# Patient Record
Sex: Female | Born: 1960 | Race: White | Hispanic: No | Marital: Married | Smoking: Never smoker
Health system: Southern US, Community
[De-identification: ages and names within clinical notes are randomized; demographics above are authoritative.]

## PROBLEM LIST (undated history)

## (undated) DIAGNOSIS — I10 Essential (primary) hypertension: Secondary | ICD-10-CM

## (undated) DIAGNOSIS — M1990 Unspecified osteoarthritis, unspecified site: Secondary | ICD-10-CM

## (undated) DIAGNOSIS — R7303 Prediabetes: Secondary | ICD-10-CM

## (undated) DIAGNOSIS — M549 Dorsalgia, unspecified: Secondary | ICD-10-CM

## (undated) HISTORY — PX: DILATION AND CURETTAGE OF UTERUS: SHX78

## (undated) HISTORY — DX: Dorsalgia, unspecified: M54.9

## (undated) HISTORY — PX: COLONOSCOPY: SHX174

## (undated) HISTORY — DX: Essential (primary) hypertension: I10

---

## 1999-02-26 ENCOUNTER — Other Ambulatory Visit: Admission: RE | Admit: 1999-02-26 | Discharge: 1999-02-26 | Payer: Self-pay | Admitting: Gynecology

## 2003-01-15 ENCOUNTER — Ambulatory Visit (HOSPITAL_COMMUNITY): Admission: RE | Admit: 2003-01-15 | Discharge: 2003-01-15 | Payer: Self-pay | Admitting: Family Medicine

## 2003-01-15 ENCOUNTER — Encounter: Payer: Self-pay | Admitting: Family Medicine

## 2006-12-07 ENCOUNTER — Encounter: Admission: RE | Admit: 2006-12-07 | Discharge: 2007-01-10 | Payer: Self-pay | Admitting: *Deleted

## 2010-10-31 ENCOUNTER — Other Ambulatory Visit (HOSPITAL_COMMUNITY): Payer: Self-pay | Admitting: Obstetrics and Gynecology

## 2010-10-31 DIAGNOSIS — R928 Other abnormal and inconclusive findings on diagnostic imaging of breast: Secondary | ICD-10-CM

## 2010-11-06 ENCOUNTER — Ambulatory Visit
Admission: RE | Admit: 2010-11-06 | Discharge: 2010-11-06 | Disposition: A | Discharge status: Home / Self Care | Payer: BC Managed Care – HMO | Source: Ambulatory Visit | Attending: Obstetrics and Gynecology | Admitting: Obstetrics and Gynecology

## 2010-11-06 ENCOUNTER — Other Ambulatory Visit (HOSPITAL_COMMUNITY): Payer: Self-pay | Admitting: Obstetrics and Gynecology

## 2010-11-06 DIAGNOSIS — R928 Other abnormal and inconclusive findings on diagnostic imaging of breast: Secondary | ICD-10-CM

## 2013-09-13 ENCOUNTER — Other Ambulatory Visit: Payer: Self-pay | Admitting: Obstetrics and Gynecology

## 2013-09-13 DIAGNOSIS — R928 Other abnormal and inconclusive findings on diagnostic imaging of breast: Secondary | ICD-10-CM

## 2013-09-21 ENCOUNTER — Other Ambulatory Visit: Payer: BC Managed Care – HMO

## 2013-09-22 ENCOUNTER — Ambulatory Visit
Admission: RE | Admit: 2013-09-22 | Discharge: 2013-09-22 | Disposition: A | Discharge status: Home / Self Care | Payer: BC Managed Care – PPO | Source: Ambulatory Visit | Attending: Obstetrics and Gynecology | Admitting: Obstetrics and Gynecology

## 2013-09-22 ENCOUNTER — Other Ambulatory Visit: Payer: Self-pay | Admitting: Obstetrics and Gynecology

## 2013-09-22 DIAGNOSIS — R928 Other abnormal and inconclusive findings on diagnostic imaging of breast: Secondary | ICD-10-CM

## 2013-10-06 ENCOUNTER — Ambulatory Visit
Admission: RE | Admit: 2013-10-06 | Discharge: 2013-10-06 | Disposition: A | Discharge status: Home / Self Care | Payer: BC Managed Care – PPO | Source: Ambulatory Visit | Attending: Obstetrics and Gynecology | Admitting: Obstetrics and Gynecology

## 2013-10-06 DIAGNOSIS — R928 Other abnormal and inconclusive findings on diagnostic imaging of breast: Secondary | ICD-10-CM

## 2014-09-13 ENCOUNTER — Other Ambulatory Visit: Payer: Self-pay | Admitting: Obstetrics and Gynecology

## 2014-11-14 ENCOUNTER — Other Ambulatory Visit: Payer: Self-pay | Admitting: Obstetrics and Gynecology

## 2014-11-15 LAB — CYTOLOGY - PAP

## 2014-11-21 IMAGING — MG MM DIANOSTIC UNILATERAL R
2 series · 2 of 2 positions shown · non-contrast
Comparison: 09/08/2013 and prior

CLINICAL DATA: The patient returns for further evaluation of a
questionable mass in the right breast identified on recent screening
mammogram performed on 09/08/2013

EXAM:
DIGITAL DIAGNOSTIC  right MAMMOGRAM WITH CAD
ULTRASOUND right BREAST

[R CC]
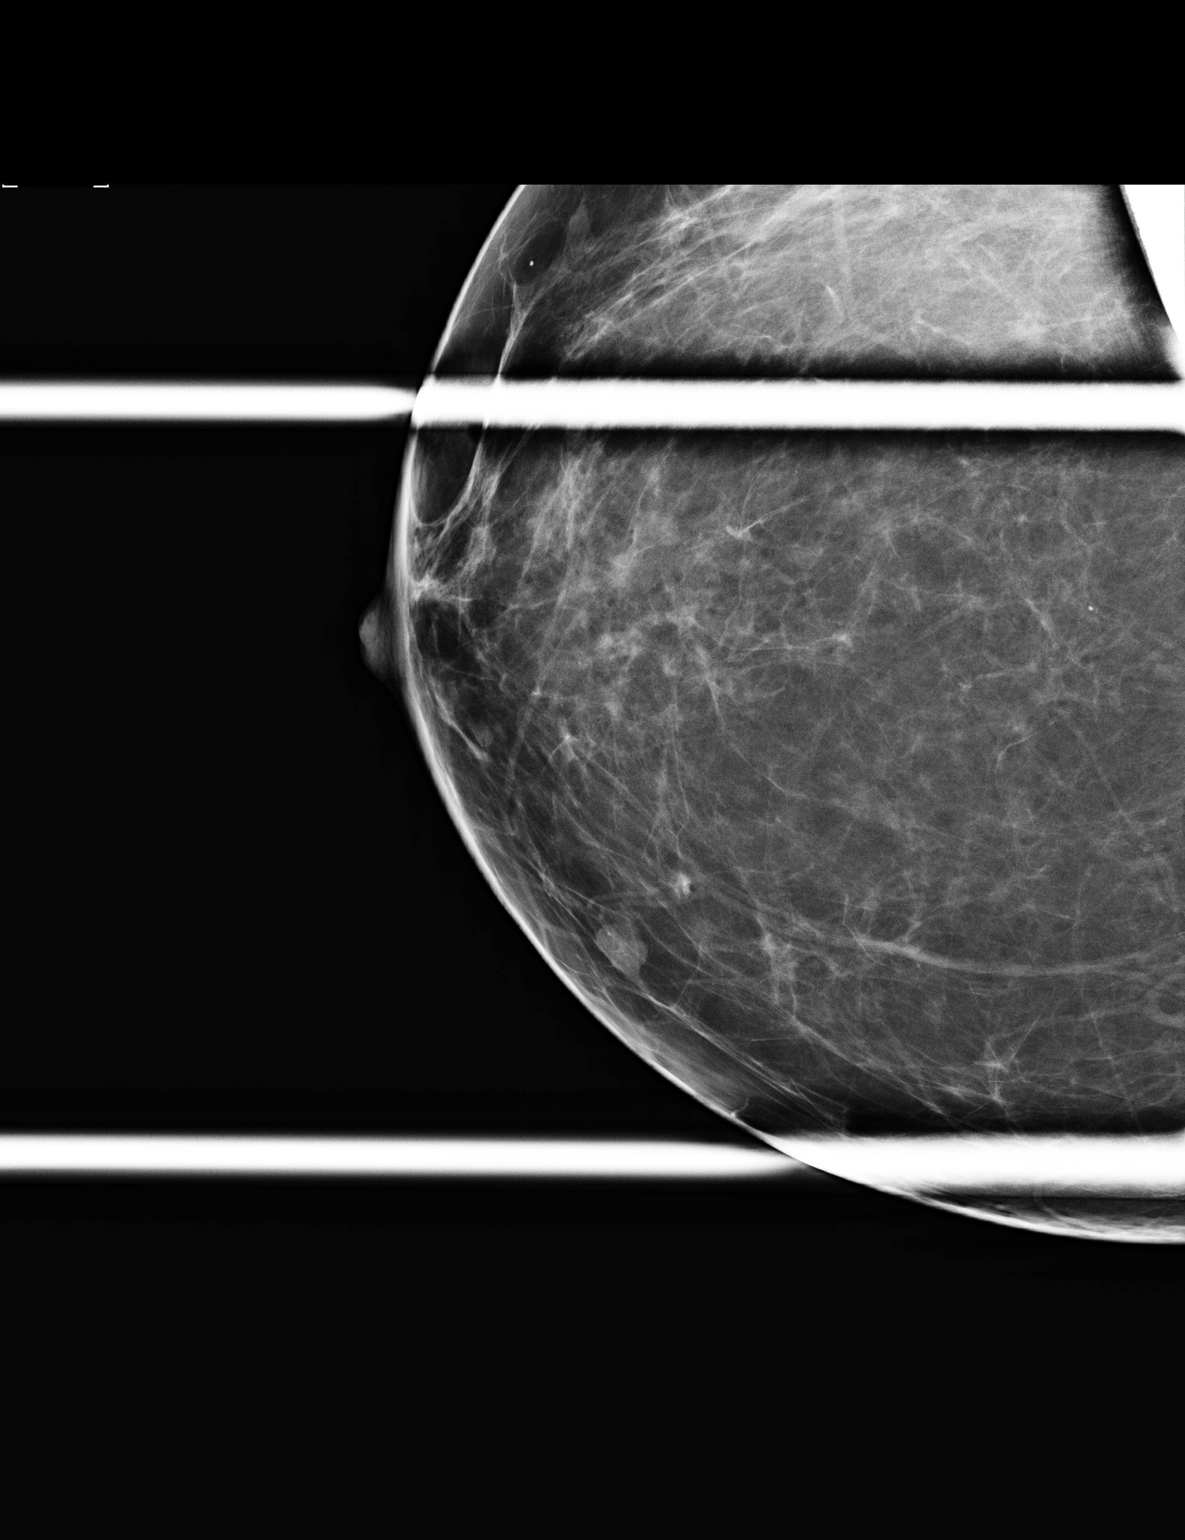

[R MLO]
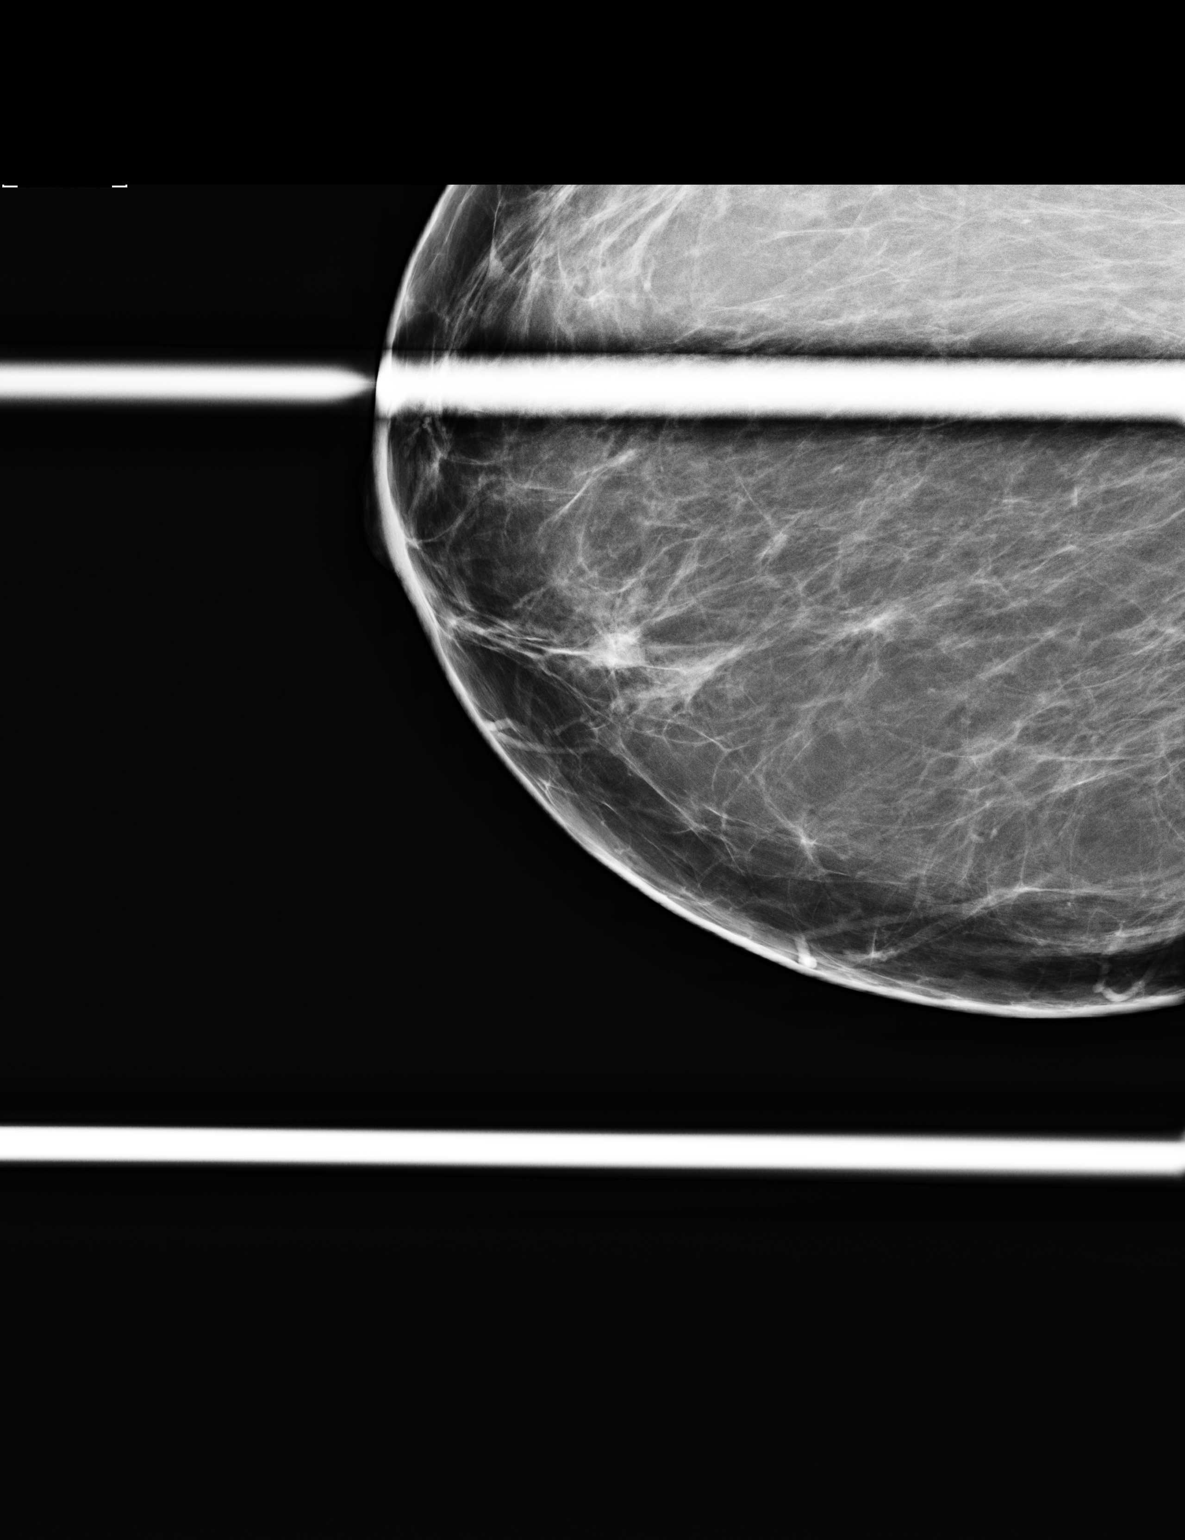

[2 of 2 positions shown; findings below may reference images not displayed]

ACR Breast Density Category b: There are scattered areas of
fibroglandular density.
FINDINGS: Spot compression views were performed in the craniocaudad and medial
lateral oblique planes and reveal a predominantly circumscribed,
partially obscured, lobulated equal density mass in the right breast
at approximately 3 o'clock measuring approximately 8 mm this
demonstrates no obvious calcifications or distortion. This was
present on prior exams dating back to 6216 but has increased
slightly in size from 6 mm in 6216.

Mammographic images were processed with CAD.

On physical exam,no palpable abnormality is identified..

Ultrasound is performed, showing a circumscribed hypoechoic oval
mildly lobulated solid mass demonstrating mild increased through
transmission measuring 7 x 5 x 4 mm at 3 o'clock position 2 cm from
the nipple. This has probably benign sonographic features and most
likely represents a small fibroadenoma..
IMPRESSION: Slight increase in size in a circumscribed nodule in the right inner
breast corresponds with a probably benign mass at 3 o'clock on
focused breast ultrasound, likely a fibroadenoma.

RECOMMENDATION:
We discussed management options including excision,
ultrasound-guided core biopsy, and short term interval follow-up.
The patient desires tissue confirmation with ultrasound guided core
biopsy and this has been scheduled at her convenience on 10/06/2013
at 3 p.m.

I have discussed the findings and recommendations with the patient.
Results were also provided in writing at the conclusion of the
visit.

BI-RADS CATEGORY  3: Probably benign finding(s) - short interval
follow-up suggested.

## 2014-11-28 ENCOUNTER — Other Ambulatory Visit: Payer: Self-pay | Admitting: Family Medicine

## 2014-11-28 DIAGNOSIS — Z0000 Encounter for general adult medical examination without abnormal findings: Secondary | ICD-10-CM

## 2014-12-05 ENCOUNTER — Other Ambulatory Visit: Payer: Self-pay

## 2014-12-05 IMAGING — MG MM DIANOSTIC UNILATERAL R
2 series · 2 of 2 positions shown · non-contrast
Comparison: Previous exams

CLINICAL DATA: Post biopsy of a mass in the right breast at 3
o'clock.

EXAM:
DIAGNOSTIC RIGHT MAMMOGRAM POST ULTRASOUND BIOPSY

[R CC]
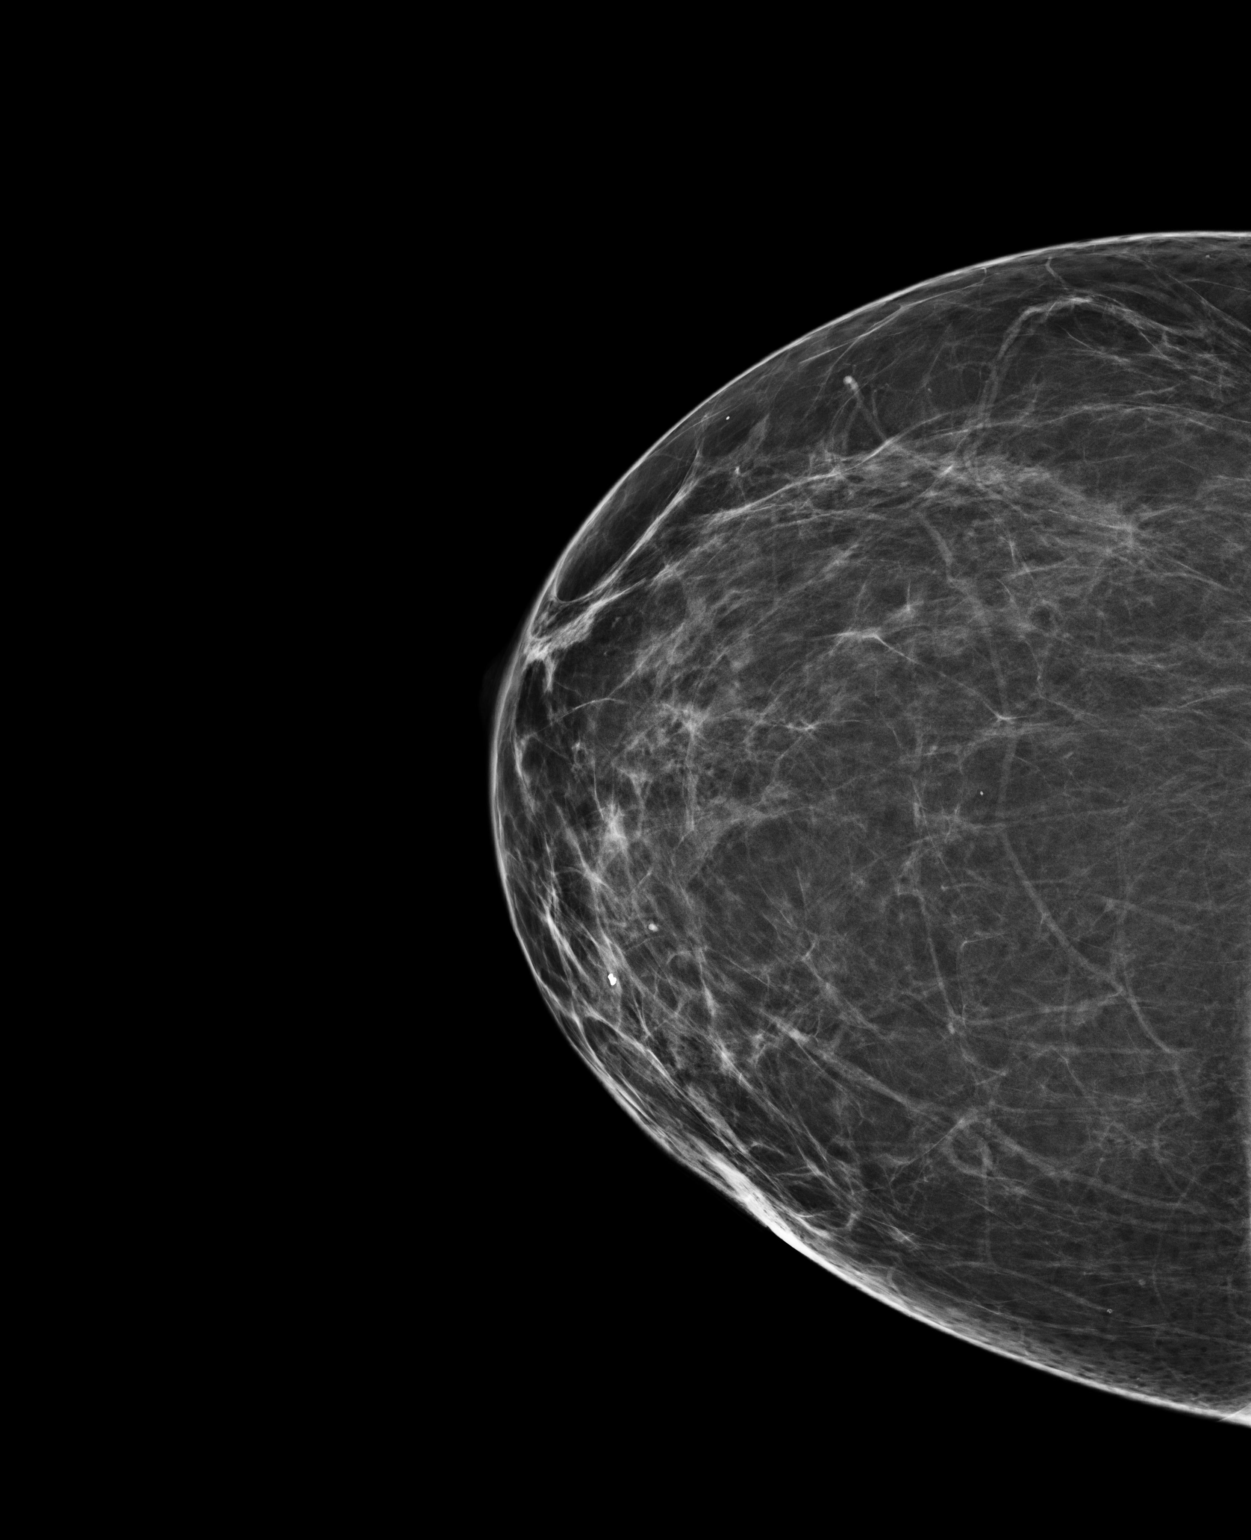

[R ML]
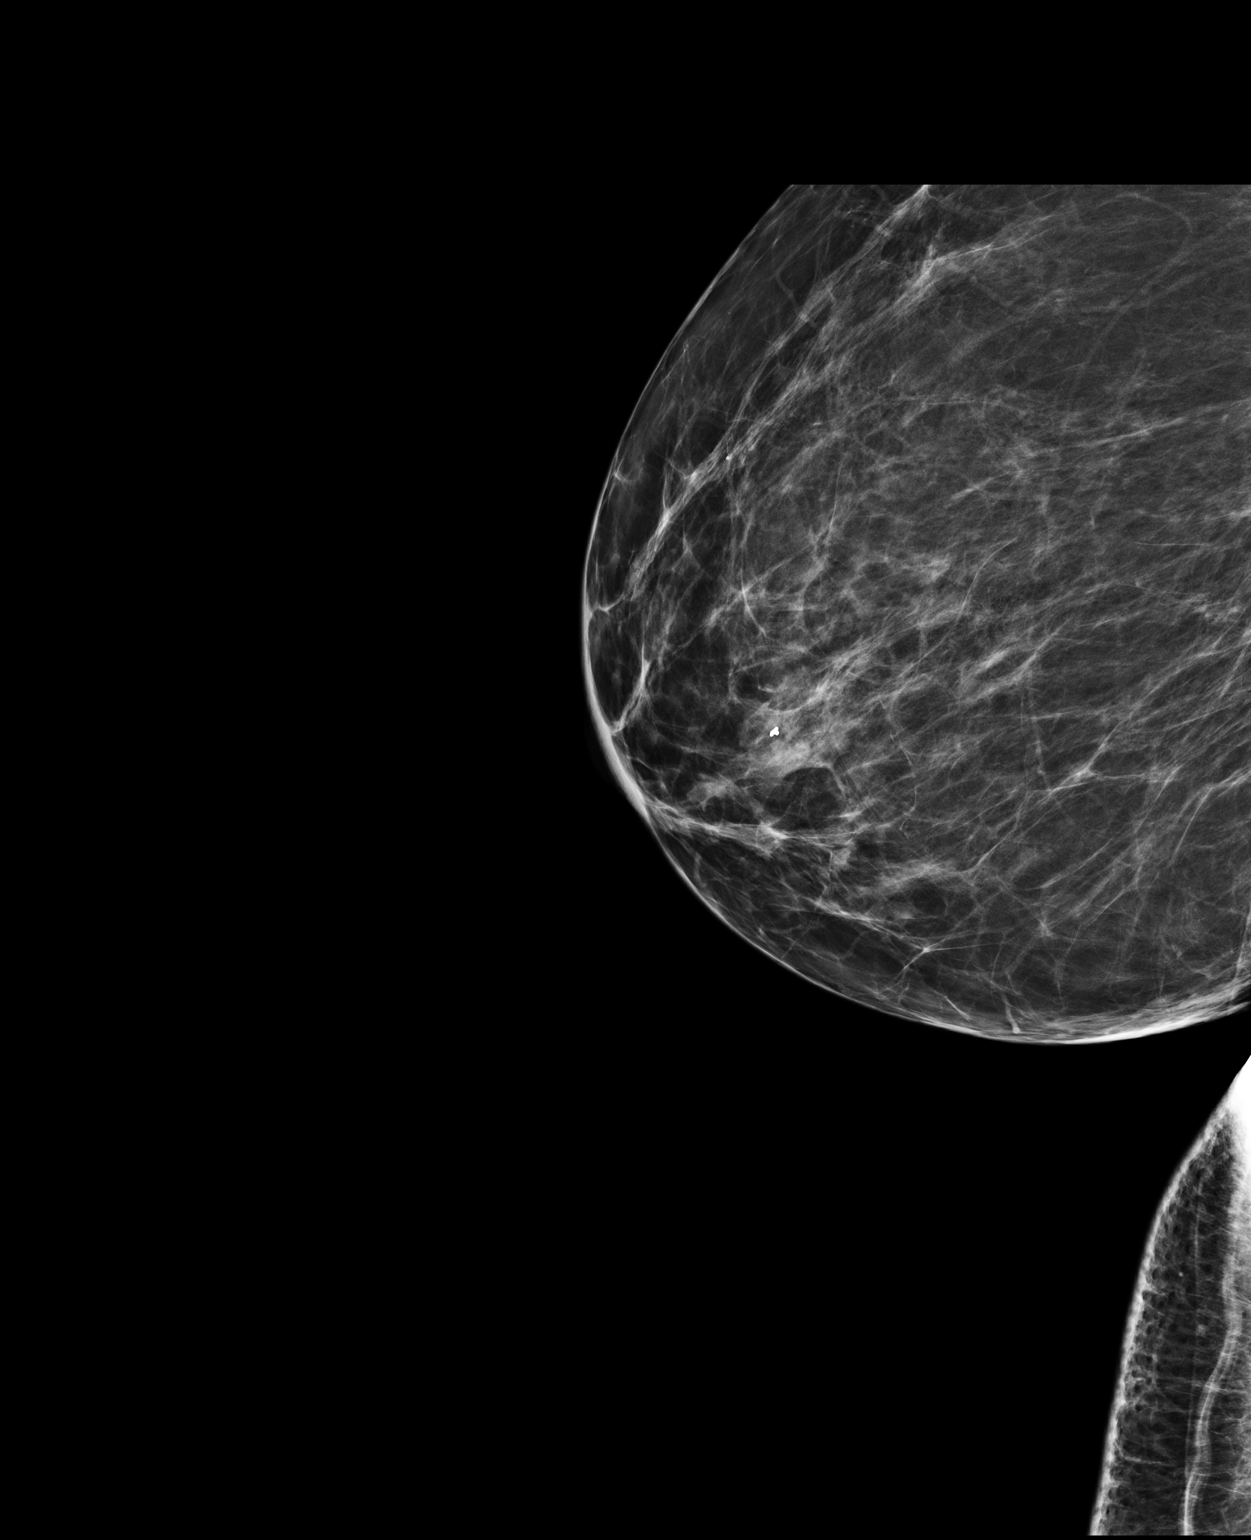

[2 of 2 positions shown; findings below may reference images not displayed]

FINDINGS: Mammographic images were obtained following ultrasound guided biopsy
of a mass in the superficial right breast at 3 o'clock. A top hat
shaped biopsy marking clip is present in the targeted region of the
mass.
IMPRESSION: Appropriate positioning of top hat shaped biopsy marking clip in the
right breast post biopsy of a mass at 3 o'clock.

Final Assessment: Post Procedure Mammograms for Marker Placement

## 2014-12-07 ENCOUNTER — Ambulatory Visit
Admission: RE | Admit: 2014-12-07 | Discharge: 2014-12-07 | Disposition: A | Discharge status: Home / Self Care | Payer: Self-pay | Source: Ambulatory Visit | Attending: Family Medicine | Admitting: Family Medicine

## 2014-12-07 DIAGNOSIS — Z0000 Encounter for general adult medical examination without abnormal findings: Secondary | ICD-10-CM

## 2014-12-11 ENCOUNTER — Other Ambulatory Visit: Payer: Self-pay | Admitting: Family Medicine

## 2014-12-11 DIAGNOSIS — E041 Nontoxic single thyroid nodule: Secondary | ICD-10-CM

## 2014-12-12 ENCOUNTER — Ambulatory Visit
Admission: RE | Admit: 2014-12-12 | Discharge: 2014-12-12 | Disposition: A | Discharge status: Home / Self Care | Payer: BLUE CROSS/BLUE SHIELD | Source: Ambulatory Visit | Attending: Family Medicine | Admitting: Family Medicine

## 2014-12-12 ENCOUNTER — Other Ambulatory Visit (HOSPITAL_COMMUNITY)
Admission: RE | Admit: 2014-12-12 | Discharge: 2014-12-12 | Disposition: A | Discharge status: Home / Self Care | Payer: BLUE CROSS/BLUE SHIELD | Source: Ambulatory Visit | Attending: Interventional Radiology | Admitting: Interventional Radiology

## 2014-12-12 DIAGNOSIS — E041 Nontoxic single thyroid nodule: Secondary | ICD-10-CM

## 2016-01-14 DIAGNOSIS — Z13820 Encounter for screening for osteoporosis: Secondary | ICD-10-CM | POA: Diagnosis not present

## 2016-03-30 DIAGNOSIS — I10 Essential (primary) hypertension: Secondary | ICD-10-CM | POA: Diagnosis not present

## 2016-03-30 DIAGNOSIS — E669 Obesity, unspecified: Secondary | ICD-10-CM | POA: Diagnosis not present

## 2016-05-21 DIAGNOSIS — H40019 Open angle with borderline findings, low risk, unspecified eye: Secondary | ICD-10-CM | POA: Diagnosis not present

## 2016-07-01 DIAGNOSIS — E559 Vitamin D deficiency, unspecified: Secondary | ICD-10-CM | POA: Diagnosis not present

## 2016-07-01 DIAGNOSIS — I10 Essential (primary) hypertension: Secondary | ICD-10-CM | POA: Diagnosis not present

## 2016-07-01 DIAGNOSIS — E669 Obesity, unspecified: Secondary | ICD-10-CM | POA: Diagnosis not present

## 2016-07-13 DIAGNOSIS — Z23 Encounter for immunization: Secondary | ICD-10-CM | POA: Diagnosis not present

## 2016-09-23 DIAGNOSIS — I10 Essential (primary) hypertension: Secondary | ICD-10-CM | POA: Diagnosis not present

## 2016-09-23 DIAGNOSIS — E669 Obesity, unspecified: Secondary | ICD-10-CM | POA: Diagnosis not present

## 2016-11-11 DIAGNOSIS — M7521 Bicipital tendinitis, right shoulder: Secondary | ICD-10-CM | POA: Diagnosis not present

## 2016-11-11 DIAGNOSIS — Z008 Encounter for other general examination: Secondary | ICD-10-CM | POA: Diagnosis not present

## 2016-11-11 DIAGNOSIS — E669 Obesity, unspecified: Secondary | ICD-10-CM | POA: Diagnosis not present

## 2016-11-21 DIAGNOSIS — R05 Cough: Secondary | ICD-10-CM | POA: Diagnosis not present

## 2016-11-21 DIAGNOSIS — B349 Viral infection, unspecified: Secondary | ICD-10-CM | POA: Diagnosis not present

## 2017-01-22 DIAGNOSIS — Z1231 Encounter for screening mammogram for malignant neoplasm of breast: Secondary | ICD-10-CM | POA: Diagnosis not present

## 2017-01-22 DIAGNOSIS — Z01419 Encounter for gynecological examination (general) (routine) without abnormal findings: Secondary | ICD-10-CM | POA: Diagnosis not present

## 2017-01-22 DIAGNOSIS — N760 Acute vaginitis: Secondary | ICD-10-CM | POA: Diagnosis not present

## 2017-01-22 DIAGNOSIS — Z6837 Body mass index (BMI) 37.0-37.9, adult: Secondary | ICD-10-CM | POA: Diagnosis not present

## 2017-02-02 DIAGNOSIS — E559 Vitamin D deficiency, unspecified: Secondary | ICD-10-CM | POA: Diagnosis not present

## 2017-02-02 DIAGNOSIS — Z0001 Encounter for general adult medical examination with abnormal findings: Secondary | ICD-10-CM | POA: Diagnosis not present

## 2017-02-02 DIAGNOSIS — E041 Nontoxic single thyroid nodule: Secondary | ICD-10-CM | POA: Diagnosis not present

## 2017-02-02 DIAGNOSIS — M25511 Pain in right shoulder: Secondary | ICD-10-CM | POA: Diagnosis not present

## 2017-02-02 DIAGNOSIS — Z131 Encounter for screening for diabetes mellitus: Secondary | ICD-10-CM | POA: Diagnosis not present

## 2017-02-04 DIAGNOSIS — E559 Vitamin D deficiency, unspecified: Secondary | ICD-10-CM | POA: Diagnosis not present

## 2017-02-04 DIAGNOSIS — Z13220 Encounter for screening for lipoid disorders: Secondary | ICD-10-CM | POA: Diagnosis not present

## 2017-02-04 DIAGNOSIS — Z0001 Encounter for general adult medical examination with abnormal findings: Secondary | ICD-10-CM | POA: Diagnosis not present

## 2017-02-04 DIAGNOSIS — E041 Nontoxic single thyroid nodule: Secondary | ICD-10-CM | POA: Diagnosis not present

## 2017-03-02 DIAGNOSIS — E669 Obesity, unspecified: Secondary | ICD-10-CM | POA: Diagnosis not present

## 2017-03-02 DIAGNOSIS — Z008 Encounter for other general examination: Secondary | ICD-10-CM | POA: Diagnosis not present

## 2017-03-02 DIAGNOSIS — I10 Essential (primary) hypertension: Secondary | ICD-10-CM | POA: Diagnosis not present

## 2017-03-02 DIAGNOSIS — E559 Vitamin D deficiency, unspecified: Secondary | ICD-10-CM | POA: Diagnosis not present

## 2017-05-24 DIAGNOSIS — I10 Essential (primary) hypertension: Secondary | ICD-10-CM | POA: Diagnosis not present

## 2017-05-24 DIAGNOSIS — E669 Obesity, unspecified: Secondary | ICD-10-CM | POA: Diagnosis not present

## 2017-05-24 DIAGNOSIS — E559 Vitamin D deficiency, unspecified: Secondary | ICD-10-CM | POA: Diagnosis not present

## 2017-07-08 DIAGNOSIS — H40019 Open angle with borderline findings, low risk, unspecified eye: Secondary | ICD-10-CM | POA: Diagnosis not present

## 2017-08-23 DIAGNOSIS — E669 Obesity, unspecified: Secondary | ICD-10-CM | POA: Diagnosis not present

## 2017-08-23 DIAGNOSIS — E559 Vitamin D deficiency, unspecified: Secondary | ICD-10-CM | POA: Diagnosis not present

## 2017-08-23 DIAGNOSIS — I10 Essential (primary) hypertension: Secondary | ICD-10-CM | POA: Diagnosis not present

## 2017-11-16 DIAGNOSIS — I10 Essential (primary) hypertension: Secondary | ICD-10-CM | POA: Diagnosis not present

## 2017-12-14 DIAGNOSIS — I10 Essential (primary) hypertension: Secondary | ICD-10-CM | POA: Diagnosis not present

## 2017-12-20 DIAGNOSIS — I10 Essential (primary) hypertension: Secondary | ICD-10-CM | POA: Diagnosis not present

## 2017-12-20 DIAGNOSIS — E559 Vitamin D deficiency, unspecified: Secondary | ICD-10-CM | POA: Diagnosis not present

## 2017-12-20 DIAGNOSIS — Z008 Encounter for other general examination: Secondary | ICD-10-CM | POA: Diagnosis not present

## 2017-12-20 DIAGNOSIS — E669 Obesity, unspecified: Secondary | ICD-10-CM | POA: Diagnosis not present

## 2018-02-03 DIAGNOSIS — Z6838 Body mass index (BMI) 38.0-38.9, adult: Secondary | ICD-10-CM | POA: Diagnosis not present

## 2018-02-03 DIAGNOSIS — Z01419 Encounter for gynecological examination (general) (routine) without abnormal findings: Secondary | ICD-10-CM | POA: Diagnosis not present

## 2018-02-03 DIAGNOSIS — Z1231 Encounter for screening mammogram for malignant neoplasm of breast: Secondary | ICD-10-CM | POA: Diagnosis not present

## 2018-02-09 DIAGNOSIS — Z0000 Encounter for general adult medical examination without abnormal findings: Secondary | ICD-10-CM | POA: Diagnosis not present

## 2018-02-09 DIAGNOSIS — Z1159 Encounter for screening for other viral diseases: Secondary | ICD-10-CM | POA: Diagnosis not present

## 2018-02-09 DIAGNOSIS — I10 Essential (primary) hypertension: Secondary | ICD-10-CM | POA: Diagnosis not present

## 2018-03-15 DIAGNOSIS — I10 Essential (primary) hypertension: Secondary | ICD-10-CM | POA: Diagnosis not present

## 2018-03-15 DIAGNOSIS — Z1159 Encounter for screening for other viral diseases: Secondary | ICD-10-CM | POA: Diagnosis not present

## 2018-03-23 DIAGNOSIS — E559 Vitamin D deficiency, unspecified: Secondary | ICD-10-CM | POA: Diagnosis not present

## 2018-03-23 DIAGNOSIS — E669 Obesity, unspecified: Secondary | ICD-10-CM | POA: Diagnosis not present

## 2018-03-23 DIAGNOSIS — I10 Essential (primary) hypertension: Secondary | ICD-10-CM | POA: Diagnosis not present

## 2018-06-21 DIAGNOSIS — J0100 Acute maxillary sinusitis, unspecified: Secondary | ICD-10-CM | POA: Diagnosis not present

## 2018-06-27 DIAGNOSIS — E559 Vitamin D deficiency, unspecified: Secondary | ICD-10-CM | POA: Diagnosis not present

## 2018-06-27 DIAGNOSIS — Z008 Encounter for other general examination: Secondary | ICD-10-CM | POA: Diagnosis not present

## 2018-06-27 DIAGNOSIS — E669 Obesity, unspecified: Secondary | ICD-10-CM | POA: Diagnosis not present

## 2018-07-18 DIAGNOSIS — Z23 Encounter for immunization: Secondary | ICD-10-CM | POA: Diagnosis not present

## 2018-09-12 DIAGNOSIS — E669 Obesity, unspecified: Secondary | ICD-10-CM | POA: Diagnosis not present

## 2018-09-12 DIAGNOSIS — I10 Essential (primary) hypertension: Secondary | ICD-10-CM | POA: Diagnosis not present

## 2018-09-12 DIAGNOSIS — E041 Nontoxic single thyroid nodule: Secondary | ICD-10-CM | POA: Diagnosis not present

## 2018-09-12 DIAGNOSIS — E559 Vitamin D deficiency, unspecified: Secondary | ICD-10-CM | POA: Diagnosis not present

## 2018-11-02 DIAGNOSIS — J069 Acute upper respiratory infection, unspecified: Secondary | ICD-10-CM | POA: Diagnosis not present

## 2018-12-14 DIAGNOSIS — I10 Essential (primary) hypertension: Secondary | ICD-10-CM | POA: Diagnosis not present

## 2018-12-14 DIAGNOSIS — E559 Vitamin D deficiency, unspecified: Secondary | ICD-10-CM | POA: Diagnosis not present

## 2018-12-14 DIAGNOSIS — E041 Nontoxic single thyroid nodule: Secondary | ICD-10-CM | POA: Diagnosis not present

## 2019-03-08 DIAGNOSIS — E559 Vitamin D deficiency, unspecified: Secondary | ICD-10-CM | POA: Diagnosis not present

## 2019-03-08 DIAGNOSIS — E041 Nontoxic single thyroid nodule: Secondary | ICD-10-CM | POA: Diagnosis not present

## 2019-03-08 DIAGNOSIS — Z0000 Encounter for general adult medical examination without abnormal findings: Secondary | ICD-10-CM | POA: Diagnosis not present

## 2019-03-08 DIAGNOSIS — I10 Essential (primary) hypertension: Secondary | ICD-10-CM | POA: Diagnosis not present

## 2019-03-29 DIAGNOSIS — I10 Essential (primary) hypertension: Secondary | ICD-10-CM | POA: Diagnosis not present

## 2019-03-29 DIAGNOSIS — Z008 Encounter for other general examination: Secondary | ICD-10-CM | POA: Diagnosis not present

## 2019-03-29 DIAGNOSIS — E669 Obesity, unspecified: Secondary | ICD-10-CM | POA: Diagnosis not present

## 2019-03-29 DIAGNOSIS — E559 Vitamin D deficiency, unspecified: Secondary | ICD-10-CM | POA: Diagnosis not present

## 2019-06-26 DIAGNOSIS — E669 Obesity, unspecified: Secondary | ICD-10-CM | POA: Diagnosis not present

## 2019-06-26 DIAGNOSIS — Z008 Encounter for other general examination: Secondary | ICD-10-CM | POA: Diagnosis not present

## 2019-06-26 DIAGNOSIS — E559 Vitamin D deficiency, unspecified: Secondary | ICD-10-CM | POA: Diagnosis not present

## 2019-07-10 DIAGNOSIS — I10 Essential (primary) hypertension: Secondary | ICD-10-CM | POA: Insufficient documentation

## 2019-07-11 DIAGNOSIS — Z6838 Body mass index (BMI) 38.0-38.9, adult: Secondary | ICD-10-CM | POA: Diagnosis not present

## 2019-07-11 DIAGNOSIS — Z1231 Encounter for screening mammogram for malignant neoplasm of breast: Secondary | ICD-10-CM | POA: Diagnosis not present

## 2019-07-11 DIAGNOSIS — Z01419 Encounter for gynecological examination (general) (routine) without abnormal findings: Secondary | ICD-10-CM | POA: Diagnosis not present

## 2019-07-17 DIAGNOSIS — Z23 Encounter for immunization: Secondary | ICD-10-CM | POA: Diagnosis not present

## 2019-09-13 DIAGNOSIS — I10 Essential (primary) hypertension: Secondary | ICD-10-CM | POA: Diagnosis not present

## 2019-09-18 DIAGNOSIS — E559 Vitamin D deficiency, unspecified: Secondary | ICD-10-CM | POA: Diagnosis not present

## 2019-09-18 DIAGNOSIS — E041 Nontoxic single thyroid nodule: Secondary | ICD-10-CM | POA: Diagnosis not present

## 2019-09-18 DIAGNOSIS — Z008 Encounter for other general examination: Secondary | ICD-10-CM | POA: Diagnosis not present

## 2019-12-09 ENCOUNTER — Other Ambulatory Visit: Payer: Self-pay

## 2019-12-09 ENCOUNTER — Ambulatory Visit: Payer: Self-pay

## 2019-12-09 ENCOUNTER — Ambulatory Visit: Payer: Self-pay | Attending: Internal Medicine

## 2019-12-09 DIAGNOSIS — Z23 Encounter for immunization: Secondary | ICD-10-CM | POA: Insufficient documentation

## 2019-12-09 NOTE — Progress Notes (Signed)
   Covid-19 Vaccination Clinic  Name:  Jessica Drake    MRN: [id number] DOB: 07-04-1961  12/09/2019  [name] was observed post Covid-19 immunization for 15 minutes without incident. She was provided with Vaccine Information Sheet and instruction to access the V-Safe system.   [name] was instructed to call 911 with any severe reactions post vaccine: Marland Kitchen Difficulty breathing  . Swelling of face and throat  . A fast heartbeat  . A bad rash all over body  . Dizziness and weakness   Immunizations Administered    Name Date Dose VIS Date Route   Moderna COVID-19 Vaccine 12/09/2019  9:01 AM 0.5 mL 09/05/2019 Intramuscular   Manufacturer: Moderna   Lot: [id number]   [id number]

## 2020-01-10 ENCOUNTER — Ambulatory Visit: Payer: Self-pay | Attending: Internal Medicine

## 2020-01-10 DIAGNOSIS — Z23 Encounter for immunization: Secondary | ICD-10-CM

## 2020-01-10 NOTE — Progress Notes (Signed)
   Covid-19 Vaccination Clinic  Name:  Jessica Drake    MRN: [id number] DOB: 1961-07-07  01/10/2020  [name] was observed post Covid-19 immunization for 15 minutes without incident. She was provided with Vaccine Information Sheet and instruction to access the V-Safe system.   [name] was instructed to call 911 with any severe reactions post vaccine: Marland Kitchen Difficulty breathing  . Swelling of face and throat  . A fast heartbeat  . A bad rash all over body  . Dizziness and weakness   Immunizations Administered    Name Date Dose VIS Date Route   Moderna COVID-19 Vaccine 01/10/2020  9:14 AM 0.5 mL 09/05/2019 Intramuscular   Manufacturer: Levan Hurst   LotUD:[id number]   [place]:[id number]

## 2020-01-16 DIAGNOSIS — L820 Inflamed seborrheic keratosis: Secondary | ICD-10-CM | POA: Diagnosis not present

## 2020-04-02 DIAGNOSIS — Z13220 Encounter for screening for lipoid disorders: Secondary | ICD-10-CM | POA: Diagnosis not present

## 2020-04-02 DIAGNOSIS — Z6839 Body mass index (BMI) 39.0-39.9, adult: Secondary | ICD-10-CM | POA: Diagnosis not present

## 2020-04-02 DIAGNOSIS — E041 Nontoxic single thyroid nodule: Secondary | ICD-10-CM | POA: Diagnosis not present

## 2020-04-02 DIAGNOSIS — I10 Essential (primary) hypertension: Secondary | ICD-10-CM | POA: Diagnosis not present

## 2020-04-02 DIAGNOSIS — Z0000 Encounter for general adult medical examination without abnormal findings: Secondary | ICD-10-CM | POA: Diagnosis not present

## 2020-04-17 ENCOUNTER — Encounter (INDEPENDENT_AMBULATORY_CARE_PROVIDER_SITE_OTHER): Payer: Self-pay | Admitting: Bariatrics

## 2020-04-22 ENCOUNTER — Ambulatory Visit (INDEPENDENT_AMBULATORY_CARE_PROVIDER_SITE_OTHER): Payer: BC Managed Care – PPO | Admitting: Bariatrics

## 2020-04-22 ENCOUNTER — Encounter (INDEPENDENT_AMBULATORY_CARE_PROVIDER_SITE_OTHER): Payer: Self-pay | Admitting: Bariatrics

## 2020-04-22 ENCOUNTER — Other Ambulatory Visit: Payer: Self-pay

## 2020-04-22 VITALS — BP 129/84 | HR 74 | Temp 98.1°F | Ht 68.0 in | Wt 244.0 lb

## 2020-04-22 DIAGNOSIS — E7800 Pure hypercholesterolemia, unspecified: Secondary | ICD-10-CM | POA: Diagnosis not present

## 2020-04-22 DIAGNOSIS — I10 Essential (primary) hypertension: Secondary | ICD-10-CM

## 2020-04-22 DIAGNOSIS — R0602 Shortness of breath: Secondary | ICD-10-CM

## 2020-04-22 DIAGNOSIS — Z1331 Encounter for screening for depression: Secondary | ICD-10-CM | POA: Diagnosis not present

## 2020-04-22 DIAGNOSIS — M5489 Other dorsalgia: Secondary | ICD-10-CM

## 2020-04-22 DIAGNOSIS — E559 Vitamin D deficiency, unspecified: Secondary | ICD-10-CM

## 2020-04-22 DIAGNOSIS — R5383 Other fatigue: Secondary | ICD-10-CM | POA: Diagnosis not present

## 2020-04-22 DIAGNOSIS — Z6837 Body mass index (BMI) 37.0-37.9, adult: Secondary | ICD-10-CM

## 2020-04-22 DIAGNOSIS — Z9189 Other specified personal risk factors, not elsewhere classified: Secondary | ICD-10-CM | POA: Diagnosis not present

## 2020-04-22 DIAGNOSIS — Z0289 Encounter for other administrative examinations: Secondary | ICD-10-CM

## 2020-04-22 NOTE — Progress Notes (Signed)
Dear Dr. Milagros Evener,   Thank you for referring Jessica Drake to our clinic. The following note includes my evaluation and treatment recommendations.  Chief Complaint:   Jessica Drake (MR# [id number]) is a 59 y.o. female who presents for evaluation and treatment of Jessica and related comorbidities. Current BMI is Body mass index is 37.1 kg/m.Marland Kitchen Jessica Drake has been struggling with her weight for many years and has been unsuccessful in either losing weight, maintaining weight loss, or reaching her healthy weight goal.  Jessica Drake is currently in the action stage of change and ready to dedicate time achieving and maintaining a healthier weight. Jessica Drake is interested in becoming our patient and working on intensive lifestyle modifications including (but not limited to) diet and exercise for weight loss.  Jessica Drake likes to [employer], but notes time as an obstacle. She craves sweets. She struggles with portion size.  Jessica Drake's habits were reviewed today and are as follows: Her family eats meals together, she thinks her family will eat healthier with her, her desired weight loss is 54 lbs, she has been heavy most of her life, she started gaining weight when she was pregnant, her heaviest weight ever was 260 pounds, she craves sweets in the afternoon, she sometimes makes poor food choices, she frequently eats larger portions than normal, she has binge eating behaviors and she struggles with emotional eating.  Depression Screen Jessica Drake's Food and Mood (modified PHQ-9) score was 8.  Depression screen PHQ 2/9 04/22/2020  Decreased Interest 1  Down, Depressed, Hopeless 1  PHQ - 2 Score 2  Altered sleeping 0  Tired, decreased energy 3  Change in appetite 1  Feeling bad or failure about yourself  2  Trouble concentrating 0  Moving slowly or fidgety/restless 0  Suicidal thoughts 0  PHQ-9 Score 8  Difficult doing work/chores Not difficult at all   Subjective:   Other fatigue. Jessica Drake denies  daytime somnolence and denies waking up still tired. Jessica Drake generally gets 6-7 hours of sleep per night, and states that she has generally restful sleep. Snoring is present. Apneic episodes are not present. Epworth Sleepiness Score is 7.  SOB (shortness of breath) on exertion. Jessica Drake notes increasing shortness of breath with exercising and seems to be worsening over time with weight gain. She notes getting out of breath sooner with activity than she used to. This has gotten worse recently. Jessica Drake denies shortness of breath at rest or orthopnea.  Essential hypertension. Jessica Drake is taking losartan. Blood pressure is controlled.  BP Readings from Last 3 Encounters:  04/22/20 129/84   No results found for: CREATININE  Vitamin D deficiency. Jessica Drake is taking OTC Vitamin D supplementation. Last Vitamin D was reported to be 40.3.  Other back pain, unspecified chronicity. Jessica Drake reports flares of back pain, but is relatively well controlled.  Elevated cholesterol. Jessica Drake is on no medication. LDL 130; total cholesterol 210; HDL 67.  Depression screening. Jessica Drake had a mildly positive depression screen with a PHQ-9 score of 8.  At risk for heart disease. Jessica Drake is at a higher than average risk for cardiovascular disease due to hypertension.   Assessment/Plan:   Other fatigue. Atia does feel that her weight is causing her energy to be lower than it should be. Fatigue may be related to Jessica, depression or many other causes. Labs will be ordered, and in the meanwhile, [name] will focus on self care including making healthy food choices, increasing physical activity and focusing on stress reduction. EKG 12-Lead  SOB (shortness of breath) on exertion. Jessica Drake does feel that she gets out of breath more easily that she used to when she exercises. Jessica Drake's shortness of breath appears to be Jessica related and exercise induced. She has agreed to work on weight loss and gradually increase exercise to treat her  exercise induced shortness of breath. Will continue to monitor closely.  Essential hypertension. Jessica Drake is working on healthy weight loss and exercise to improve blood pressure control. We will watch for signs of hypotension as she continues her lifestyle modifications. She will continue losartan as directed.  Vitamin D deficiency. Low Vitamin D level contributes to fatigue and are associated with Jessica, breast, and colon cancer. She agrees to continue to take OTC Vitamin D as directed and will follow-up for routine testing of Vitamin D, at least 2-3 times per year to avoid over-replacement.  Other back pain, unspecified chronicity. Jessica Drake will continue to do stretches as directed.  Elevated cholesterol. Jessica Drake will avoid trans fats and will increase PUFA's and MUFA's. Hemoglobin A1c, Insulin, random will be checked today.  Depression screening. Jessica Drake had a positive depression screening. Depression is commonly associated with Jessica and often results in emotional eating behaviors. We will monitor this closely and work on CBT to help improve the non-hunger eating patterns. Referral to Psychology may be required if no improvement is seen as she continues in our clinic.  At risk for heart disease. Jessica Drake was given approximately 15 minutes of coronary artery disease prevention counseling today. She is 59 y.o. female and has risk factors for heart disease including Jessica. We discussed intensive lifestyle modifications today with an emphasis on specific weight loss instructions and strategies.   Repetitive spaced learning was employed today to elicit superior memory formation and behavioral change.  Class 2 severe Jessica with serious comorbidity and body mass index (BMI) of 37.0 to 37.9 in adult, unspecified Jessica type ([name]).  Jessica Drake is currently in the action stage of change and her goal is to continue with weight loss efforts. I recommend Jessica Drake begin the structured treatment plan as  follows:  She has agreed to the Category 1 Plan.  She will work on meal planning, portion size, and will cut out all sugary drinks.  We reviewed with the patient labs from 04/02/2020 including CMP and lipids; labs from 03/07/2020 including TSH and Vitamin D.  Exercise goals: All adults should avoid inactivity. Some physical activity is better than none, and adults who participate in any amount of physical activity gain some health benefits.   Behavioral modification strategies: increasing lean protein intake, decreasing simple carbohydrates, increasing vegetables, increasing water intake, decreasing eating out, no skipping meals, meal planning and cooking strategies, keeping healthy foods in the home and planning for success.  She was informed of the importance of frequent follow-up visits to maximize her success with intensive lifestyle modifications for her multiple health conditions. She was informed we would discuss her lab results at her next visit unless there is a critical issue that needs to be addressed sooner. Monea agreed to keep her next visit at the agreed upon time to discuss these results.  Objective:   Blood pressure 129/84, pulse 74, temperature 98.1 F (36.7 C), height 5\' 8"  (1.727 m), weight 244 lb (110.7 kg), SpO2 96 %. Body mass index is 37.1 kg/m.  EKG: Sinus  Rhythm with a rate of 73 BPM. Left axis - anterior fascicular block. Poor R-wave progression - nonspecific - may be related to body habitus. Otherwise normal.  Indirect Calorimeter completed today shows a VO2 of 196 and a REE of 1364.  Her calculated basal metabolic rate is 6222 thus her basal metabolic rate is worse than expected.  General: Cooperative, alert, well developed, in no acute distress. HEENT: Conjunctivae and lids unremarkable. Cardiovascular: Regular rhythm.  Lungs: Normal work of breathing. Neurologic: No focal deficits.   No results found for: CREATININE, BUN, NA, K, CL, CO2 No results  found for: ALT, AST, GGT, ALKPHOS, BILITOT No results found for: HGBA1C No results found for: INSULIN No results found for: TSH No results found for: CHOL, HDL, LDLCALC, LDLDIRECT, TRIG, CHOLHDL No results found for: WBC, HGB, HCT, MCV, PLT No results found for: IRON, TIBC, FERRITIN  Attestation Statements:   Reviewed by clinician on day of visit: allergies, medications, problem list, medical history, surgical history, family history, social history, and previous encounter notes.  Migdalia Dk, am acting as Location manager for [employer], DO   I have reviewed the above documentation for accuracy and completeness, and I agree with the above. Jearld Lesch, DO

## 2020-04-23 ENCOUNTER — Encounter (INDEPENDENT_AMBULATORY_CARE_PROVIDER_SITE_OTHER): Payer: Self-pay | Admitting: Bariatrics

## 2020-04-23 DIAGNOSIS — R7303 Prediabetes: Secondary | ICD-10-CM | POA: Insufficient documentation

## 2020-04-23 LAB — HEMOGLOBIN A1C
Est. average glucose Bld gHb Est-mCnc: 117 mg/dL
Hgb A1c MFr Bld: 5.7 % — ABNORMAL HIGH (ref 4.8–5.6)

## 2020-04-23 LAB — INSULIN, RANDOM: INSULIN: 10.7 u[IU]/mL (ref 2.6–24.9)

## 2020-04-24 DIAGNOSIS — J069 Acute upper respiratory infection, unspecified: Secondary | ICD-10-CM | POA: Diagnosis not present

## 2020-05-06 ENCOUNTER — Other Ambulatory Visit: Payer: Self-pay

## 2020-05-06 ENCOUNTER — Encounter (INDEPENDENT_AMBULATORY_CARE_PROVIDER_SITE_OTHER): Payer: Self-pay | Admitting: Bariatrics

## 2020-05-06 ENCOUNTER — Ambulatory Visit (INDEPENDENT_AMBULATORY_CARE_PROVIDER_SITE_OTHER): Payer: BC Managed Care – PPO | Admitting: Bariatrics

## 2020-05-06 VITALS — BP 120/75 | HR 73 | Temp 97.9°F | Ht 66.0 in | Wt 238.0 lb

## 2020-05-06 DIAGNOSIS — R7303 Prediabetes: Secondary | ICD-10-CM | POA: Diagnosis not present

## 2020-05-06 DIAGNOSIS — E559 Vitamin D deficiency, unspecified: Secondary | ICD-10-CM | POA: Diagnosis not present

## 2020-05-06 DIAGNOSIS — I10 Essential (primary) hypertension: Secondary | ICD-10-CM | POA: Diagnosis not present

## 2020-05-06 DIAGNOSIS — Z6836 Body mass index (BMI) 36.0-36.9, adult: Secondary | ICD-10-CM

## 2020-05-06 DIAGNOSIS — E66812 Obesity, class 2: Secondary | ICD-10-CM

## 2020-05-06 DIAGNOSIS — Z6838 Body mass index (BMI) 38.0-38.9, adult: Secondary | ICD-10-CM

## 2020-05-07 DIAGNOSIS — E6609 Other obesity due to excess calories: Secondary | ICD-10-CM | POA: Insufficient documentation

## 2020-05-07 DIAGNOSIS — Z6838 Body mass index (BMI) 38.0-38.9, adult: Secondary | ICD-10-CM | POA: Insufficient documentation

## 2020-05-07 NOTE — Progress Notes (Signed)
Chief Complaint:   OBESITY Jessica Drake is here to discuss her progress with her obesity treatment plan along with follow-up of her obesity related diagnoses. Jessica Drake is on the Category 1 Plan and states she is following her eating plan approximately 100% of the time. Jessica Drake states she is exercising 0 minutes 0 times per week.  Today's visit was #: 2 Starting weight: 244 lbs Starting date: 04/22/2020 Today's weight: 238 lbs Today's date: 05/06/2020 Total lbs lost to date: 6 Total lbs lost since last in-office visit: 6  Interim History: Jessica Drake is down 6 lbs. She only struggled with being hungry at dinnertime.  Subjective:   Prediabetes. Jessica Drake has a diagnosis of prediabetes based on her elevated HgA1c and was informed this puts her at greater risk of developing diabetes. She continues to work on diet and exercise to decrease her risk of diabetes. She denies nausea or hypoglycemia. Jessica Drake is on no medications.  Lab Results  Component Value Date   HGBA1C 5.7 (H) 04/22/2020   Lab Results  Component Value Date   INSULIN 10.7 04/22/2020   Essential hypertension. Jessica Drake is taking Cozaar. Blood pressure is controlled.  BP Readings from Last 3 Encounters:  05/06/20 120/75  04/22/20 129/84   No results found for: CREATININE  Vitamin D deficiency. Jessica Drake is taking OTC Vitamin D supplementation.   Assessment/Plan:   Prediabetes. Jessica Drake will continue to work on weight loss, exercise, and decreasing simple carbohydrates to help decrease the risk of diabetes.   Essential hypertension. Jessica Drake is working on healthy weight loss and exercise to improve blood pressure control. We will watch for signs of hypotension as she continues her lifestyle modifications. She will continue Cozaar as directed.   Vitamin D deficiency. Low Vitamin D level contributes to fatigue and are associated with obesity, breast, and colon cancer. She agrees to continue to take OTC Vitamin D as directed and  will follow-up for routine testing of Vitamin D, at least 2-3 times per year to avoid over-replacement.  Class 2 severe obesity with serious comorbidity and body mass index (BMI) of 36.0 to 36.9 in adult, unspecified obesity type (Jessica Drake).  Jessica Drake is currently in the action stage of change. As such, her goal is to continue with weight loss efforts. She has agreed to the Category 1 Plan.   She will work on meal planning, intentional eating, and will eat raw vegetables in the afternoon.  Handouts were provided on Eating Out and On The Road.  Exercise goals: All adults should avoid inactivity. Some physical activity is better than none, and adults who participate in any amount of physical activity gain some health benefits.  Behavioral modification strategies: increasing lean protein intake, decreasing simple carbohydrates, increasing vegetables, increasing water intake, decreasing eating out, no skipping meals, meal planning and cooking strategies, keeping healthy foods in the home and planning for success.  Jessica Drake has agreed to follow-up with our clinic in 2-3 weeks. She was informed of the importance of frequent follow-up visits to maximize her success with intensive lifestyle modifications for her multiple health conditions.   Objective:   Blood pressure 120/75, pulse 73, temperature 97.9 F (36.6 C), height 5\' 6"  (1.676 m), weight 238 lb (108 kg), SpO2 98 %. Body mass index is 38.41 kg/m.  General: Cooperative, alert, well developed, in no acute distress. HEENT: Conjunctivae and lids unremarkable. Cardiovascular: Regular rhythm.  Lungs: Normal work of breathing. Neurologic: No focal deficits.   No results found for: CREATININE, BUN, NA, K,  CL, CO2 No results found for: ALT, AST, GGT, ALKPHOS, BILITOT Lab Results  Component Value Date   HGBA1C 5.7 (H) 04/22/2020   Lab Results  Component Value Date   INSULIN 10.7 04/22/2020   No results found for: TSH No results found for: CHOL,  HDL, LDLCALC, LDLDIRECT, TRIG, CHOLHDL No results found for: WBC, HGB, HCT, MCV, PLT No results found for: IRON, TIBC, FERRITIN  Attestation Statements:   Reviewed by clinician on day of visit: allergies, medications, problem list, medical history, surgical history, family history, social history, and previous encounter notes.  Time spent on visit including pre-visit chart review and post-visit charting and care was 30 minutes.   Migdalia Dk, am acting as Location manager for [employer], DO   I have reviewed the above documentation for accuracy and completeness, and I agree with the above. Jearld Lesch, DO

## 2020-05-20 ENCOUNTER — Encounter (INDEPENDENT_AMBULATORY_CARE_PROVIDER_SITE_OTHER): Payer: Self-pay | Admitting: Bariatrics

## 2020-05-20 ENCOUNTER — Other Ambulatory Visit: Payer: Self-pay

## 2020-05-20 ENCOUNTER — Ambulatory Visit (INDEPENDENT_AMBULATORY_CARE_PROVIDER_SITE_OTHER): Payer: BC Managed Care – PPO | Admitting: Bariatrics

## 2020-05-20 VITALS — BP 137/77 | HR 64 | Temp 98.0°F | Ht 68.0 in | Wt 235.0 lb

## 2020-05-20 DIAGNOSIS — R7303 Prediabetes: Secondary | ICD-10-CM

## 2020-05-20 DIAGNOSIS — Z6835 Body mass index (BMI) 35.0-35.9, adult: Secondary | ICD-10-CM

## 2020-05-20 DIAGNOSIS — I10 Essential (primary) hypertension: Secondary | ICD-10-CM | POA: Diagnosis not present

## 2020-05-21 ENCOUNTER — Ambulatory Visit (INDEPENDENT_AMBULATORY_CARE_PROVIDER_SITE_OTHER): Payer: BC Managed Care – PPO | Admitting: Bariatrics

## 2020-05-21 NOTE — Progress Notes (Signed)
Chief Complaint:   OBESITY Jessica Drake is here to discuss her progress with her obesity treatment plan along with follow-up of her obesity related diagnoses. [name] is on the Category 1 Plan and states she is following her eating plan approximately 98% of the time. Franceen states she is using weights 60 minutes 3 times per week and walking 30 minutes 2 times per week.  Today's visit was #: 3 Starting weight: 244 lbs Starting date: 04/22/2020 Today's weight: 235 lbs Today's date: 05/20/2020 Total lbs lost to date: 9 Total lbs lost since last in-office visit: 3  Interim History: Jessica Drake is down 3 lbs. She is retaining some fluid.  Subjective:   Prediabetes. Jessica Drake has a diagnosis of prediabetes based on her elevated HgA1c and was informed this puts her at greater risk of developing diabetes. She continues to work on diet and exercise to decrease her risk of diabetes. She denies nausea or hypoglycemia. Jessica Drake is on no medication.  Lab Results  Component Value Date   HGBA1C 5.7 (H) 04/22/2020   Lab Results  Component Value Date   INSULIN 10.7 04/22/2020   Essential hypertension. Jessica Drake is taking Cozaar.  BP Readings from Last 3 Encounters:  05/20/20 137/77  05/06/20 120/75  04/22/20 129/84   No results found for: CREATININE  Assessment/Plan:   Prediabetes. Jessica Drake will continue to work on weight loss, exercise, increasing healthy fats and protein, and decreasing simple carbohydrates to help decrease the risk of diabetes.   Essential hypertension. [name] is working on healthy weight loss and exercise to improve blood pressure control. We will watch for signs of hypotension as she continues her lifestyle modifications. She will continue her medication as directed.   Class 2 severe obesity with serious comorbidity and body mass index (BMI) of 35.0 to 35.9 in adult, unspecified obesity type ([place]).  Jessica Drake is currently in the action stage of change. As such, her goal is to  continue with weight loss efforts. She has agreed to the Category 1 Plan.   She will work on meal planning and intentional eating.   Exercise goals: All adults should avoid inactivity. Some physical activity is better than none, and adults who participate in any amount of physical activity gain some health benefits.  Behavioral modification strategies: increasing lean protein intake, decreasing simple carbohydrates, increasing vegetables, increasing water intake, decreasing eating out, no skipping meals, meal planning and cooking strategies, keeping healthy foods in the home and planning for success.  Jessica Drake has agreed to follow-up with our clinic in 2-3 weeks. She was informed of the importance of frequent follow-up visits to maximize her success with intensive lifestyle modifications for her multiple health conditions.   Objective:   Blood pressure 137/77, pulse 64, temperature 98 F (36.7 C), height 5\' 8"  (1.727 m), weight 235 lb (106.6 kg), SpO2 99 %. Body mass index is 35.73 kg/m.  General: Cooperative, alert, well developed, in no acute distress. HEENT: Conjunctivae and lids unremarkable. Cardiovascular: Regular rhythm.  Lungs: Normal work of breathing. Neurologic: No focal deficits.   No results found for: CREATININE, BUN, NA, K, CL, CO2 No results found for: ALT, AST, GGT, ALKPHOS, BILITOT Lab Results  Component Value Date   HGBA1C 5.7 (H) 04/22/2020   Lab Results  Component Value Date   INSULIN 10.7 04/22/2020   No results found for: TSH No results found for: CHOL, HDL, LDLCALC, LDLDIRECT, TRIG, CHOLHDL No results found for: WBC, HGB, HCT, MCV, PLT No results found for: IRON,  TIBC, FERRITIN  Attestation Statements:   Reviewed by clinician on day of visit: allergies, medications, problem list, medical history, surgical history, family history, social history, and previous encounter notes.  Time spent on visit including pre-visit chart review and post-visit charting  and care was 20 minutes.   Migdalia Dk, am acting as Location manager for [employer], DO   I have reviewed the above documentation for accuracy and completeness, and I agree with the above. Jearld Lesch, DO

## 2020-05-22 ENCOUNTER — Encounter (INDEPENDENT_AMBULATORY_CARE_PROVIDER_SITE_OTHER): Payer: Self-pay | Admitting: Bariatrics

## 2020-06-06 ENCOUNTER — Other Ambulatory Visit: Payer: Self-pay

## 2020-06-06 ENCOUNTER — Ambulatory Visit (INDEPENDENT_AMBULATORY_CARE_PROVIDER_SITE_OTHER): Payer: BC Managed Care – PPO | Admitting: Bariatrics

## 2020-06-06 ENCOUNTER — Encounter (INDEPENDENT_AMBULATORY_CARE_PROVIDER_SITE_OTHER): Payer: Self-pay | Admitting: Bariatrics

## 2020-06-06 VITALS — BP 137/76 | HR 66 | Temp 97.9°F | Ht 68.0 in | Wt 230.0 lb

## 2020-06-06 DIAGNOSIS — E66812 Obesity, class 2: Secondary | ICD-10-CM

## 2020-06-06 DIAGNOSIS — I10 Essential (primary) hypertension: Secondary | ICD-10-CM

## 2020-06-06 DIAGNOSIS — Z6835 Body mass index (BMI) 35.0-35.9, adult: Secondary | ICD-10-CM

## 2020-06-06 DIAGNOSIS — R7303 Prediabetes: Secondary | ICD-10-CM | POA: Diagnosis not present

## 2020-06-11 NOTE — Progress Notes (Signed)
Chief Complaint:   OBESITY Jessica Drake is here to discuss her progress with her obesity treatment plan along with follow-up of her obesity related diagnoses. Jessica Drake is on the Category 1 Plan and states she is following her eating plan approximately 98% of the time. Jessica Drake states she is working with weights 60 minutes 3 times per week and walking 20 minutes 2-3 times per week.  Today's visit was #: 4 Starting weight: 244 lbs Starting date: 04/22/2020 Today's weight: 230 lbs Today's date: 06/06/2020 Total lbs lost to date: 14 Total lbs lost since last in-office visit: 5  Interim History: Jessica Drake is down 5 lbs and doing well overall. She states she has increased her exercises.  Subjective:   Essential hypertension. Jessica Drake is taking Cozaar.  BP Readings from Last 3 Encounters:  06/06/20 137/76  05/20/20 137/77  05/06/20 120/75   No results found for: CREATININE  Prediabetes. Jessica Drake has a diagnosis of prediabetes based on her elevated HgA1c and was informed this puts her at greater risk of developing diabetes. She continues to work on diet and exercise to decrease her risk of diabetes. She denies polyphagia or hypoglycemia.  Lab Results  Component Value Date   HGBA1C 5.7 (H) 04/22/2020   Lab Results  Component Value Date   INSULIN 10.7 04/22/2020   Assessment/Plan:   Essential hypertension. Jessica Drake is working on healthy weight loss and exercise to improve blood pressure control. We will watch for signs of hypotension as she continues her lifestyle modifications. Jessica Drake will continue her medication as directed and will avoid adding salt to her diet.  Prediabetes. Jessica Drake will continue to work on weight loss, exercise, and decreasing simple carbohydrates to help decrease the risk of diabetes. She will increase exercise/activities, continue to work on diet and exercise, and increase her intake of raw vegetables if hungry.  Class 2 severe obesity with serious comorbidity and  body mass index (BMI) of 35.0 to 35.9 in adult, unspecified obesity type ([place]).  Jessica Drake is currently in the action stage of change. As such, her goal is to continue with weight loss efforts. She has agreed to the Category 1 Plan.   She will continue to adhere strictly to the plan and will increase her water intake.  Exercise goals: Jessica Drake will continue walking and will increase over time.  Behavioral modification strategies: increasing lean protein intake, decreasing simple carbohydrates, increasing vegetables, increasing water intake, decreasing eating out, no skipping meals, meal planning and cooking strategies, keeping healthy foods in the home and planning for success.  Jessica Drake has agreed to follow-up with our clinic in 2-3 weeks. She was informed of the importance of frequent follow-up visits to maximize her success with intensive lifestyle modifications for her multiple health conditions.   Objective:   Blood pressure 137/76, pulse 66, temperature 97.9 F (36.6 C), height 5\' 8"  (1.727 m), weight 230 lb (104.3 kg), SpO2 99 %. Body mass index is 34.97 kg/m.  General: Cooperative, alert, well developed, in no acute distress. HEENT: Conjunctivae and lids unremarkable. Cardiovascular: Regular rhythm.  Lungs: Normal work of breathing. Neurologic: No focal deficits.   No results found for: CREATININE, BUN, NA, K, CL, CO2 No results found for: ALT, AST, GGT, ALKPHOS, BILITOT Lab Results  Component Value Date   HGBA1C 5.7 (H) 04/22/2020   Lab Results  Component Value Date   INSULIN 10.7 04/22/2020   No results found for: TSH No results found for: CHOL, HDL, LDLCALC, LDLDIRECT, TRIG, CHOLHDL No results found  for: WBC, HGB, HCT, MCV, PLT No results found for: IRON, TIBC, FERRITIN  Attestation Statements:   Reviewed by clinician on day of visit: allergies, medications, problem list, medical history, surgical history, family history, social history, and previous encounter  notes.  Time spent on visit including pre-visit chart review and post-visit charting and care was 20 minutes.   Migdalia Dk, am acting as Location manager for [employer], DO   I have reviewed the above documentation for accuracy and completeness, and I agree with the above. Jearld Lesch, DO

## 2020-06-13 ENCOUNTER — Encounter (INDEPENDENT_AMBULATORY_CARE_PROVIDER_SITE_OTHER): Payer: Self-pay | Admitting: Bariatrics

## 2020-06-27 ENCOUNTER — Ambulatory Visit (INDEPENDENT_AMBULATORY_CARE_PROVIDER_SITE_OTHER): Payer: BC Managed Care – PPO | Admitting: Bariatrics

## 2020-06-27 ENCOUNTER — Encounter (INDEPENDENT_AMBULATORY_CARE_PROVIDER_SITE_OTHER): Payer: Self-pay | Admitting: Bariatrics

## 2020-06-27 ENCOUNTER — Other Ambulatory Visit: Payer: Self-pay

## 2020-06-27 VITALS — BP 131/70 | HR 65 | Temp 97.9°F | Ht 68.0 in | Wt 226.0 lb

## 2020-06-27 DIAGNOSIS — R7303 Prediabetes: Secondary | ICD-10-CM | POA: Diagnosis not present

## 2020-06-27 DIAGNOSIS — E669 Obesity, unspecified: Secondary | ICD-10-CM

## 2020-06-27 DIAGNOSIS — E559 Vitamin D deficiency, unspecified: Secondary | ICD-10-CM

## 2020-06-27 DIAGNOSIS — Z6834 Body mass index (BMI) 34.0-34.9, adult: Secondary | ICD-10-CM | POA: Diagnosis not present

## 2020-07-01 NOTE — Progress Notes (Signed)
Chief Complaint:   OBESITY Jessica Drake is here to discuss her progress with her obesity treatment plan along with follow-up of her obesity related diagnoses. Jessica Drake is on the Category 1 Plan and states she is following her eating plan approximately 90% of the time. Jessica Drake states she is lifting weights for 60 minutes 3 times per week and walking for 20-30 minutes 3-4 times per week.  Today's visit was #: 5 Starting weight: 244 lbs Starting date: 04/22/2020 Today's weight: 226 lbs Today's date: 06/27/2020 Total lbs lost to date: 18 lbs Total lbs lost since last in-office visit: 4 lbs  Interim History: Jessica Drake is down 4 pounds and doing well overall.  She is doing okay with her water.  Subjective:   1. Vitamin D deficiency She is currently taking a multivitamin. She denies nausea, vomiting or muscle weakness.    2. Prediabetes Jessica Drake has a diagnosis of prediabetes based on her elevated HgA1c and was informed this puts her at greater risk of developing diabetes. She continues to work on diet and exercise to decrease her risk of diabetes. She denies nausea or hypoglycemia.  She is not taking medication.   Lab Results  Component Value Date   HGBA1C 5.7 (H) 04/22/2020   Lab Results  Component Value Date   INSULIN 10.7 04/22/2020   Assessment/Plan:   1. Vitamin D deficiency Low Vitamin D level contributes to fatigue and are associated with obesity, breast, and colon cancer. She agrees to continue to take prescription Vitamin D @50 ,000 IU every week and will follow-up for routine testing of Vitamin D, at least 2-3 times per year to avoid over-replacement.  2. Prediabetes Jolanda will continue to work on weight loss, exercise, and decreasing simple carbohydrates to help decrease the risk of diabetes.  Decrease carbohydrates and increase healthy proteins.  3. Class 1 obesity with serious comorbidity and body mass index (BMI) of 34.0 to 34.9 in adult, unspecified obesity type  Jessica Drake is  currently in the action stage of change. As such, her goal is to continue with weight loss efforts. She has agreed to the Category 1 Plan.   She will work on meal planning and intentional eating.  Decrease all or nothing thinking.  Exercise goals: Continue weighs/walking.  Behavioral modification strategies: increasing lean protein intake, decreasing simple carbohydrates, increasing vegetables, increasing water intake, decreasing eating out, no skipping meals, meal planning and cooking strategies, keeping healthy foods in the home and planning for success.  Jessica Drake has agreed to follow-up with our clinic in 3 weeks. She was informed of the importance of frequent follow-up visits to maximize her success with intensive lifestyle modifications for her multiple health conditions.   Objective:   Blood pressure 131/70, pulse 65, temperature 97.9 F (36.6 C), height 5\' 8"  (1.727 m), weight 226 lb (102.5 kg), SpO2 98 %. Body mass index is 34.36 kg/m.  General: Cooperative, alert, well developed, in no acute distress. HEENT: Conjunctivae and lids unremarkable. Cardiovascular: Regular rhythm.  Lungs: Normal work of breathing. Neurologic: No focal deficits.   Lab Results  Component Value Date   HGBA1C 5.7 (H) 04/22/2020   Lab Results  Component Value Date   INSULIN 10.7 04/22/2020   Attestation Statements:   Reviewed by clinician on day of visit: allergies, medications, problem list, medical history, surgical history, family history, social history, and previous encounter notes.  Time spent on visit including pre-visit chart review and post-visit care and charting was 20 minutes.   I, Water quality scientist, CMA,  am acting as Location manager for [employer], DO  I have reviewed the above documentation for accuracy and completeness, and I agree with the above. Jearld Lesch, DO

## 2020-07-02 ENCOUNTER — Encounter (INDEPENDENT_AMBULATORY_CARE_PROVIDER_SITE_OTHER): Payer: Self-pay | Admitting: Bariatrics

## 2020-07-09 DIAGNOSIS — E042 Nontoxic multinodular goiter: Secondary | ICD-10-CM | POA: Diagnosis not present

## 2020-07-10 ENCOUNTER — Other Ambulatory Visit: Payer: Self-pay | Admitting: Surgery

## 2020-07-10 DIAGNOSIS — E049 Nontoxic goiter, unspecified: Secondary | ICD-10-CM

## 2020-07-10 DIAGNOSIS — E042 Nontoxic multinodular goiter: Secondary | ICD-10-CM

## 2020-07-16 ENCOUNTER — Ambulatory Visit
Admission: RE | Admit: 2020-07-16 | Discharge: 2020-07-16 | Disposition: A | Discharge status: Home / Self Care | Payer: BC Managed Care – PPO | Source: Ambulatory Visit | Attending: Surgery | Admitting: Surgery

## 2020-07-16 DIAGNOSIS — E010 Iodine-deficiency related diffuse (endemic) goiter: Secondary | ICD-10-CM | POA: Diagnosis not present

## 2020-07-16 DIAGNOSIS — E042 Nontoxic multinodular goiter: Secondary | ICD-10-CM

## 2020-07-16 DIAGNOSIS — E049 Nontoxic goiter, unspecified: Secondary | ICD-10-CM

## 2020-07-25 ENCOUNTER — Ambulatory Visit (INDEPENDENT_AMBULATORY_CARE_PROVIDER_SITE_OTHER): Payer: BC Managed Care – PPO | Admitting: Bariatrics

## 2020-07-25 ENCOUNTER — Encounter (INDEPENDENT_AMBULATORY_CARE_PROVIDER_SITE_OTHER): Payer: Self-pay | Admitting: Bariatrics

## 2020-07-25 ENCOUNTER — Other Ambulatory Visit: Payer: Self-pay

## 2020-07-25 VITALS — BP 130/84 | HR 61 | Temp 97.8°F | Ht 68.0 in | Wt 224.0 lb

## 2020-07-25 DIAGNOSIS — Z6834 Body mass index (BMI) 34.0-34.9, adult: Secondary | ICD-10-CM

## 2020-07-25 DIAGNOSIS — R944 Abnormal results of kidney function studies: Secondary | ICD-10-CM

## 2020-07-25 DIAGNOSIS — I152 Hypertension secondary to endocrine disorders: Secondary | ICD-10-CM | POA: Diagnosis not present

## 2020-07-25 DIAGNOSIS — E1159 Type 2 diabetes mellitus with other circulatory complications: Secondary | ICD-10-CM | POA: Diagnosis not present

## 2020-07-25 DIAGNOSIS — E669 Obesity, unspecified: Secondary | ICD-10-CM | POA: Diagnosis not present

## 2020-07-29 NOTE — Progress Notes (Signed)
Chief Complaint:   OBESITY [name] is here to discuss her progress with her obesity treatment plan along with follow-up of her obesity related diagnoses. Jessica Drake is on the Category 1 Plan and states she is following her eating plan approximately 90% of the time. Jessica Drake states she is doing strengthening 60 minutes 3 times per week and cardio 30 minutes 2 times per week.  Today's visit was #: 6 Starting weight: 244 lbs Starting date: 04/22/2020 Today's weight: 224 lbs Today's date: 07/25/2020 Total lbs lost to date: 20 Total lbs lost since last in-office visit: 2  Interim History: Jessica Drake is down an additional 2 lbs and has done well overall.  Subjective:   Hypertension associated with diabetes (Jessica Drake). Jessica Drake is taking Cozaar.  BP Readings from Last 3 Encounters:  07/25/20 130/84  06/27/20 131/70  06/06/20 137/76   No results found for: CREATININE  Decreased GFR. Jessica Drake is taking Mobic and cyclosporine. GFR 48.  Assessment/Plan:   Hypertension associated with diabetes (Jessica Drake). Jessica Drake is working on healthy weight loss and exercise to improve blood pressure control. We will watch for signs of hypotension as she continues her lifestyle modifications. She will continue her medication as directed.   Decreased GFR. Jessica Drake was instructed to call her PCP and increase her water intake. We discussed possible medications that may be contributing to her lower GFR. She will discuss with her PCP ASAP.   Class 1 obesity with serious comorbidity and body mass index (BMI) of 34.0 to 34.9 in adult, unspecified obesity type.  Jessica Drake is currently in the action stage of change. As such, her goal is to continue with weight loss efforts. She has agreed to the Category 1 Plan.   She will work on meal planning and intentional eating.   Exercise goals: Jessica Drake will continue strengthening and cardio for exercise.  Behavioral modification strategies: increasing lean protein intake, decreasing  simple carbohydrates, increasing vegetables, increasing water intake, decreasing eating out, no skipping meals, meal planning and cooking strategies, keeping healthy foods in the home and planning for success.  Jessica Drake has agreed to follow-up with our clinic fasting in 2-3 weeks. She was informed of the importance of frequent follow-up visits to maximize her success with intensive lifestyle modifications for her multiple health conditions.   Objective:   Blood pressure 130/84, pulse 61, temperature 97.8 F (36.6 C), height 5\' 8"  (1.727 m), weight 224 lb (101.6 kg), SpO2 97 %. Body mass index is 34.06 kg/m.  General: Cooperative, alert, well developed, in no acute distress. HEENT: Conjunctivae and lids unremarkable. Cardiovascular: Regular rhythm.  Lungs: Normal work of breathing. Neurologic: No focal deficits.   No results found for: CREATININE, BUN, NA, K, CL, CO2 No results found for: ALT, AST, GGT, ALKPHOS, BILITOT Lab Results  Component Value Date   HGBA1C 5.7 (H) 04/22/2020   Lab Results  Component Value Date   INSULIN 10.7 04/22/2020   No results found for: TSH No results found for: CHOL, HDL, LDLCALC, LDLDIRECT, TRIG, CHOLHDL No results found for: WBC, HGB, HCT, MCV, PLT No results found for: IRON, TIBC, FERRITIN  Attestation Statements:   Reviewed by clinician on day of visit: allergies, medications, problem list, medical history, surgical history, family history, social history, and previous encounter notes.  Time spent on visit including pre-visit chart review and post-visit charting and care was 20 minutes.   Jessica Drake Dk, am acting as Location manager for [employer], DO   I have reviewed the above documentation for  accuracy and completeness, and I agree with the above. Jearld Lesch, DO

## 2020-08-05 ENCOUNTER — Encounter (INDEPENDENT_AMBULATORY_CARE_PROVIDER_SITE_OTHER): Payer: Self-pay | Admitting: Bariatrics

## 2020-08-08 DIAGNOSIS — Z23 Encounter for immunization: Secondary | ICD-10-CM | POA: Diagnosis not present

## 2020-08-15 ENCOUNTER — Ambulatory Visit (INDEPENDENT_AMBULATORY_CARE_PROVIDER_SITE_OTHER): Payer: BC Managed Care – PPO | Admitting: Bariatrics

## 2020-08-22 ENCOUNTER — Ambulatory Visit (INDEPENDENT_AMBULATORY_CARE_PROVIDER_SITE_OTHER): Payer: BC Managed Care – PPO | Admitting: Bariatrics

## 2020-08-22 ENCOUNTER — Encounter (INDEPENDENT_AMBULATORY_CARE_PROVIDER_SITE_OTHER): Payer: Self-pay | Admitting: Bariatrics

## 2020-08-22 ENCOUNTER — Other Ambulatory Visit: Payer: Self-pay

## 2020-08-22 VITALS — BP 131/76 | HR 63 | Temp 97.7°F | Ht 68.0 in | Wt 225.0 lb

## 2020-08-22 DIAGNOSIS — Z6834 Body mass index (BMI) 34.0-34.9, adult: Secondary | ICD-10-CM | POA: Diagnosis not present

## 2020-08-22 DIAGNOSIS — I10 Essential (primary) hypertension: Secondary | ICD-10-CM

## 2020-08-22 DIAGNOSIS — E669 Obesity, unspecified: Secondary | ICD-10-CM | POA: Diagnosis not present

## 2020-08-27 ENCOUNTER — Encounter (INDEPENDENT_AMBULATORY_CARE_PROVIDER_SITE_OTHER): Payer: Self-pay | Admitting: Bariatrics

## 2020-08-27 NOTE — Progress Notes (Signed)
     Chief Complaint:   OBESITY Jessica Drake is here to discuss her progress with her obesity treatment plan along with follow-up of her obesity related diagnoses. Jessica Drake is on the Category 1 Plan and states she is following her eating plan approximately 80% of the time. Jessica Drake states she is doing weights 60 minutes 2-3 times per week and cardio 30 minutes 2 times per week.  Today's visit was #: 7 Starting weight: 244 lbs Starting date: 04/22/2020 Today's weight: 225 lbs Today's date: 08/22/2020 Total lbs lost to date: 19 Total lbs lost since last in-office visit: 0  Interim History: Jessica Drake is up 1 lb. She is going to [facility] for surgery and will be staying in [place], [place].  Subjective:   Essential hypertension. Blood pressure is reasonably well controlled.  BP Readings from Last 3 Encounters:  08/22/20 131/76  07/25/20 130/84  06/27/20 131/70   No results found for: CREATININE  Assessment/Plan:   Essential hypertension. Jessica Drake is working on healthy weight loss and exercise to improve blood pressure control. We will watch for signs of hypotension as she continues her lifestyle modifications. She will continue her medication as directed.   Class 1 obesity with serious comorbidity and body mass index (BMI) of 34.0 to 34.9 in adult, unspecified obesity type.  Jessica Drake is currently in the action stage of change. As such, her goal is to continue with weight loss efforts. She has agreed to the Category 1 Plan.   She will adhere to the plan more closely.  Strategies for the holidays were discussed.  Exercise goals: Jessica Drake will continue her current exercise regimen.   Behavioral modification strategies: increasing lean protein intake, decreasing simple carbohydrates, increasing vegetables, increasing water intake, decreasing eating out, no skipping meals, meal planning and cooking strategies, keeping healthy foods in the home, travel eating strategies, holiday eating  strategies , celebration eating strategies, avoiding temptations and planning for success.  Jessica Drake has agreed to follow-up with our clinic in 2 weeks. She was informed of the importance of frequent follow-up visits to maximize her success with intensive lifestyle modifications for her multiple health conditions.   Objective:   Blood pressure 131/76, pulse 63, temperature 97.7 F (36.5 C), height 5\' 8"  (1.727 m), weight 225 lb (102.1 kg), SpO2 99 %. Body mass index is 34.21 kg/m.  General: Cooperative, alert, well developed, in no acute distress. HEENT: Conjunctivae and lids unremarkable. Cardiovascular: Regular rhythm.  Lungs: Normal work of breathing. Neurologic: No focal deficits.   No results found for: CREATININE, BUN, NA, K, CL, CO2 No results found for: ALT, AST, GGT, ALKPHOS, BILITOT Lab Results  Component Value Date   HGBA1C 5.7 (H) 04/22/2020   Lab Results  Component Value Date   INSULIN 10.7 04/22/2020   No results found for: TSH No results found for: CHOL, HDL, LDLCALC, LDLDIRECT, TRIG, CHOLHDL No results found for: WBC, HGB, HCT, MCV, PLT No results found for: IRON, TIBC, FERRITIN  Attestation Statements:   Reviewed by clinician on day of visit: allergies, medications, problem list, medical history, surgical history, family history, social history, and previous encounter notes.  Time spent on visit including pre-visit chart review and post-visit charting and care was 20 minutes.   Migdalia Dk, am acting as Location manager for [employer], DO   I have reviewed the above documentation for accuracy and completeness, and I agree with the above. Jearld Lesch, DO

## 2020-09-05 ENCOUNTER — Encounter (INDEPENDENT_AMBULATORY_CARE_PROVIDER_SITE_OTHER): Payer: Self-pay | Admitting: Bariatrics

## 2020-09-05 ENCOUNTER — Ambulatory Visit (INDEPENDENT_AMBULATORY_CARE_PROVIDER_SITE_OTHER): Payer: BC Managed Care – PPO | Admitting: Bariatrics

## 2020-09-05 ENCOUNTER — Other Ambulatory Visit: Payer: Self-pay

## 2020-09-05 VITALS — BP 116/73 | HR 66 | Temp 97.8°F | Ht 68.0 in | Wt 225.0 lb

## 2020-09-05 DIAGNOSIS — E559 Vitamin D deficiency, unspecified: Secondary | ICD-10-CM | POA: Diagnosis not present

## 2020-09-05 DIAGNOSIS — I10 Essential (primary) hypertension: Secondary | ICD-10-CM | POA: Diagnosis not present

## 2020-09-05 DIAGNOSIS — Z6834 Body mass index (BMI) 34.0-34.9, adult: Secondary | ICD-10-CM | POA: Diagnosis not present

## 2020-09-05 DIAGNOSIS — E669 Obesity, unspecified: Secondary | ICD-10-CM | POA: Diagnosis not present

## 2020-09-09 NOTE — Progress Notes (Signed)
Chief Complaint:   OBESITY NERA HAWORTH is here to discuss her progress with her obesity treatment plan along with follow-up of her obesity related diagnoses. Corita is on the Category 1 Plan and states she is following her eating plan approximately 50% of the time. Kailee states she is exercising 0 minutes 0 times per week.  Today's visit was #: 8 Starting weight: 244 lbs Starting date: 04/22/2020 Today's weight: 225 lbs Today's date: 09/05/2020 Total lbs lost to date: 19 Total lbs lost since last in-office visit: 0  Interim History: Miley's weight remains the same over the holiday. She reports being more stressed over the holiday.  Subjective:   Vitamin D deficiency. Yvone is taking OTC Vitamin D supplementation.   Essential hypertension. Jahnasia is taking Cozaar. Blood pressure is controlled.  BP Readings from Last 3 Encounters:  09/05/20 116/73  08/22/20 131/76  07/25/20 130/84   No results found for: CREATININE  Assessment/Plan:   Vitamin D deficiency. Low Vitamin D level contributes to fatigue and are associated with obesity, breast, and colon cancer. She agrees to continue to take OTC Vitamin D as directed and will follow-up for routine testing of Vitamin D, at least 2-3 times per year to avoid over-replacement.  Essential hypertension. Dedra is working on healthy weight loss and exercise to improve blood pressure control. We will watch for signs of hypotension as she continues her lifestyle modifications. She will continue Cozaar as directed.   Class 1 obesity with serious comorbidity and body mass index (BMI) of 34.0 to 34.9 in adult, unspecified obesity type.  Rebbie is currently in the action stage of change. As such, her goal is to continue with weight loss efforts. She has agreed to the Category 1 Plan.   She will be more adherent to the plan with no meal skipping, will work on meal planning, and increasing her water intake.  Exercise goals: Sharran  will resume weights and elliptical.  Behavioral modification strategies: increasing lean protein intake, decreasing simple carbohydrates, increasing vegetables, increasing water intake, decreasing eating out, no skipping meals, meal planning and cooking strategies, keeping healthy foods in the home, celebration eating strategies and avoiding temptations.  Tammey has agreed to follow-up with our clinic fasting in 3 weeks. She was informed of the importance of frequent follow-up visits to maximize her success with intensive lifestyle modifications for her multiple health conditions.   Objective:   Blood pressure 116/73, pulse 66, temperature 97.8 F (36.6 C), height 5\' 8"  (1.727 m), weight 225 lb (102.1 kg), SpO2 100 %. Body mass index is 34.21 kg/m.  General: Cooperative, alert, well developed, in no acute distress. HEENT: Conjunctivae and lids unremarkable. Cardiovascular: Regular rhythm.  Lungs: Normal work of breathing. Neurologic: No focal deficits.   No results found for: CREATININE, BUN, NA, K, CL, CO2 No results found for: ALT, AST, GGT, ALKPHOS, BILITOT Lab Results  Component Value Date   HGBA1C 5.7 (H) 04/22/2020   Lab Results  Component Value Date   INSULIN 10.7 04/22/2020   No results found for: TSH No results found for: CHOL, HDL, LDLCALC, LDLDIRECT, TRIG, CHOLHDL No results found for: WBC, HGB, HCT, MCV, PLT No results found for: IRON, TIBC, FERRITIN  Attestation Statements:   Reviewed by clinician on day of visit: allergies, medications, problem list, medical history, surgical history, family history, social history, and previous encounter notes.  Time spent on visit including pre-visit chart review and post-visit charting and care was 20 minutes.   I,  Michaelene Song, am acting as Location manager for [employer], DO   I have reviewed the above documentation for accuracy and completeness, and I agree with the above. Jearld Lesch, DO

## 2020-09-16 ENCOUNTER — Encounter (INDEPENDENT_AMBULATORY_CARE_PROVIDER_SITE_OTHER): Payer: Self-pay | Admitting: Bariatrics

## 2020-10-08 ENCOUNTER — Ambulatory Visit (INDEPENDENT_AMBULATORY_CARE_PROVIDER_SITE_OTHER): Payer: BC Managed Care – PPO | Admitting: Bariatrics

## 2020-10-08 ENCOUNTER — Encounter (INDEPENDENT_AMBULATORY_CARE_PROVIDER_SITE_OTHER): Payer: Self-pay | Admitting: Bariatrics

## 2020-10-08 ENCOUNTER — Other Ambulatory Visit: Payer: Self-pay

## 2020-10-08 VITALS — BP 136/86 | HR 76 | Temp 98.2°F | Ht 68.0 in | Wt 224.0 lb

## 2020-10-08 DIAGNOSIS — E669 Obesity, unspecified: Secondary | ICD-10-CM

## 2020-10-08 DIAGNOSIS — I10 Essential (primary) hypertension: Secondary | ICD-10-CM | POA: Diagnosis not present

## 2020-10-08 DIAGNOSIS — E559 Vitamin D deficiency, unspecified: Secondary | ICD-10-CM | POA: Diagnosis not present

## 2020-10-08 DIAGNOSIS — R7303 Prediabetes: Secondary | ICD-10-CM | POA: Diagnosis not present

## 2020-10-08 DIAGNOSIS — Z9189 Other specified personal risk factors, not elsewhere classified: Secondary | ICD-10-CM

## 2020-10-08 DIAGNOSIS — Z6834 Body mass index (BMI) 34.0-34.9, adult: Secondary | ICD-10-CM

## 2020-10-09 ENCOUNTER — Encounter (INDEPENDENT_AMBULATORY_CARE_PROVIDER_SITE_OTHER): Payer: Self-pay | Admitting: Bariatrics

## 2020-10-09 NOTE — Progress Notes (Signed)
Chief Complaint:   OBESITY Jessica Drake is here to discuss her progress with her obesity treatment plan along with follow-up of her obesity related diagnoses. Jessica Drake is on the Category 1 Plan and states she is following her eating plan approximately 60% of the time. Jessica Drake states she is doing cardio for 20-30 minutes 7 times per week.  Today's visit was #: 9 Starting weight: 244 lbs Starting date: 04/22/2020 Today's weight: 224 lbs Today's date: 10/08/2020 Total lbs lost to date: 20 lbs Total lbs lost since last in-office visit: 1 lb  Interim History: Jessica Drake is down an additional 1 pound and has done well overall.  She "stepped up the cardio".  Subjective:   1. Vitamin D deficiency She is currently taking OTC vitamin D. She denies nausea, vomiting or muscle weakness.  2. Prediabetes Jessica Drake has a diagnosis of prediabetes based on her elevated HgA1c and was informed this puts her at greater risk of developing diabetes. She continues to work on diet and exercise to decrease her risk of diabetes. She denies nausea or hypoglycemia.  She denies polyphagia.  Lab Results  Component Value Date   HGBA1C 5.7 (H) 04/22/2020   Lab Results  Component Value Date   INSULIN 10.7 04/22/2020   3. Essential hypertension Review: taking medications as instructed, no medication side effects noted, no chest pain on exertion, no dyspnea on exertion, no swelling of ankles.  She is taking Cozaar.  Blood pressure is reasonably well controlled.   BP Readings from Last 3 Encounters:  10/08/20 136/86  09/05/20 116/73  08/22/20 131/76   4. At risk of diabetes mellitus Jessica Drake is at higher than average risk for developing diabetes due to obesity.   Assessment/Plan:   1. Vitamin D deficiency Low Vitamin D level contributes to fatigue and are associated with obesity, breast, and colon cancer. She agrees to continue to take OTC vitamin D daily.  Will check vitamin D level at next visit.  2.  Prediabetes Jessica Drake will continue to work on weight loss, exercise, and decreasing simple carbohydrates to help decrease the risk of diabetes.  Will check A1c and insulin level at next visit.  3. Essential hypertension Jessica Drake is working on healthy weight loss and exercise to improve blood pressure control. We will watch for signs of hypotension as she continues her lifestyle modifications.  She will continue her medications.  Will check CMP at next visit.  4. At risk of diabetes mellitus Jessica Drake was given approximately 15 minutes of diabetes education and counseling today. We discussed intensive lifestyle modifications today with an emphasis on weight loss as well as increasing exercise and decreasing simple carbohydrates in her diet. We also reviewed medication options with an emphasis on risk versus benefit of those discussed.   Repetitive spaced learning was employed today to elicit superior memory formation and behavioral change.  5. Class 1 obesity with serious comorbidity and body mass index (BMI) of 34.0 to 34.9 in adult, unspecified obesity type  Jessica Drake is currently in the action stage of change. As such, her goal is to continue with weight loss efforts. She has agreed to following a lower carbohydrate, vegetable and lean protein rich diet plan.   She will work on meal planning, intentional eating, getting in adequate protein and water, and changing to a low carb diet.  Exercise goals: Will concentrate on cardio.  Behavioral modification strategies: increasing lean protein intake, decreasing simple carbohydrates, increasing vegetables, increasing water intake, decreasing eating out, no skipping meals, meal  planning and cooking strategies, keeping healthy foods in the home and planning for success.  Jessica Drake has agreed to follow-up with our clinic in 2-3 weeks, fasting.  Needs IC at next visit. She was informed of the importance of frequent follow-up visits to maximize her success with  intensive lifestyle modifications for her multiple health conditions.   Jessica Drake was informed we would discuss her lab results at her next visit unless there is a critical issue that needs to be addressed sooner. Jessica Drake agreed to keep her next visit at the agreed upon time to discuss these results.  Objective:   Blood pressure 136/86, pulse 76, temperature 98.2 F (36.8 C), temperature source Oral, height 5\' 8"  (1.727 m), weight 224 lb (101.6 kg), SpO2 97 %. Body mass index is 34.06 kg/m.  General: Cooperative, alert, well developed, in no acute distress. HEENT: Conjunctivae and lids unremarkable. Cardiovascular: Regular rhythm.  Lungs: Normal work of breathing. Neurologic: No focal deficits.   Lab Results  Component Value Date   HGBA1C 5.7 (H) 04/22/2020   Lab Results  Component Value Date   INSULIN 10.7 04/22/2020   Attestation Statements:   Reviewed by clinician on day of visit: allergies, medications, problem list, medical history, surgical history, family history, social history, and previous encounter notes.  Time spent on visit including pre-visit chart review and post-visit care and charting was 20 minutes.   I, Water quality scientist, CMA, am acting as Location manager for [employer], DO  I have reviewed the above documentation for accuracy and completeness, and I agree with the above. Jearld Lesch, DO

## 2020-10-10 DIAGNOSIS — E559 Vitamin D deficiency, unspecified: Secondary | ICD-10-CM | POA: Diagnosis not present

## 2020-10-10 DIAGNOSIS — R7303 Prediabetes: Secondary | ICD-10-CM | POA: Diagnosis not present

## 2020-10-10 DIAGNOSIS — I10 Essential (primary) hypertension: Secondary | ICD-10-CM | POA: Diagnosis not present

## 2020-10-10 NOTE — Addendum Note (Signed)
Addended by: Len Blalock on: 10/10/2020 08:19 AM   Modules accepted: Orders

## 2020-10-11 LAB — COMPREHENSIVE METABOLIC PANEL
ALT: 27 IU/L (ref 0–32)
AST: 26 IU/L (ref 0–40)
Albumin/Globulin Ratio: 1.8 (ref 1.2–2.2)
Albumin: 4.3 g/dL (ref 3.8–4.8)
Alkaline Phosphatase: 52 IU/L (ref 44–121)
BUN/Creatinine Ratio: 13 (ref 12–28)
BUN: 13 mg/dL (ref 8–27)
Bilirubin Total: 0.5 mg/dL (ref 0.0–1.2)
CO2: 22 mmol/L (ref 20–29)
Calcium: 9.7 mg/dL (ref 8.7–10.3)
Chloride: 104 mmol/L (ref 96–106)
Creatinine, Ser: 1.01 mg/dL — ABNORMAL HIGH (ref 0.57–1.00)
GFR calc Af Amer: 69 mL/min/{1.73_m2} (ref >59)
GFR calc non Af Amer: 60 mL/min/{1.73_m2} (ref >59)
Globulin, Total: 2.4 g/dL (ref 1.5–4.5)
Glucose: 91 mg/dL (ref 65–99)
Potassium: 4.8 mmol/L (ref 3.5–5.2)
Sodium: 144 mmol/L (ref 134–144)
Total Protein: 6.7 g/dL (ref 6.0–8.5)

## 2020-10-11 LAB — VITAMIN D 25 HYDROXY (VIT D DEFICIENCY, FRACTURES): Vit D, 25-Hydroxy: 50 ng/mL (ref 30.0–100.0)

## 2020-10-11 LAB — HEMOGLOBIN A1C
Est. average glucose Bld gHb Est-mCnc: 108 mg/dL
Hgb A1c MFr Bld: 5.4 % (ref 4.8–5.6)

## 2020-10-11 LAB — INSULIN, RANDOM: INSULIN: 6 u[IU]/mL (ref 2.6–24.9)

## 2020-10-24 ENCOUNTER — Telehealth (INDEPENDENT_AMBULATORY_CARE_PROVIDER_SITE_OTHER): Payer: BC Managed Care – PPO | Admitting: Bariatrics

## 2020-10-24 ENCOUNTER — Encounter (INDEPENDENT_AMBULATORY_CARE_PROVIDER_SITE_OTHER): Payer: Self-pay | Admitting: Bariatrics

## 2020-10-24 DIAGNOSIS — Z6834 Body mass index (BMI) 34.0-34.9, adult: Secondary | ICD-10-CM

## 2020-10-24 DIAGNOSIS — E559 Vitamin D deficiency, unspecified: Secondary | ICD-10-CM

## 2020-10-24 DIAGNOSIS — R252 Cramp and spasm: Secondary | ICD-10-CM | POA: Diagnosis not present

## 2020-10-24 DIAGNOSIS — E669 Obesity, unspecified: Secondary | ICD-10-CM

## 2020-10-28 ENCOUNTER — Encounter (INDEPENDENT_AMBULATORY_CARE_PROVIDER_SITE_OTHER): Payer: Self-pay | Admitting: Bariatrics

## 2020-10-28 NOTE — Progress Notes (Signed)
TeleHealth Visit:  Due to the COVID-19 pandemic, this visit was completed with telemedicine (audio/video) technology to reduce patient and provider exposure as well as to preserve personal protective equipment.   [place] has verbally consented to this TeleHealth visit. The patient is located at home, the provider is located at the [facility] office. The participants in this visit include the listed provider and patient. The visit was conducted today via MyChart video.  Chief Complaint: OBESITY Jessica Drake is here to discuss her progress with her obesity treatment plan along with follow-up of her obesity related diagnoses. Jessica Drake is on a keto diet and states she is following her eating plan approximately 90% of the time. Jessica Drake states she is doing cardio for 30 minutes 7 times per week.  Today's visit was #: 10 Starting weight: 244 lbs Starting date: 04/22/2020  Interim History: Jessica Drake has lost some weight.  She says she has leg cramps.  Subjective:   1. Leg cramps Drinking adequate water.  Increasing stairs and working out more.  2. Vitamin D deficiency Ahlana's Vitamin D level was 50.0 on 10/10/2020. She is currently taking OTC vitamin D.. She denies nausea, vomiting or muscle weakness.  Assessment/Plan:   1. Leg cramps Decrease activities, use a heating pad, start taking magnesium 300 mg daily and do epsom salt baths (Dr. Ballard Russell).  2. Vitamin D deficiency Low Vitamin D level contributes to fatigue and are associated with obesity, breast, and colon cancer.  She will continue OTC vitamin D.  3. Class 1 obesity with serious comorbidity and body mass index (BMI) of 34.0 to 34.9 in adult, unspecified obesity type  Jessica Drake is currently in the action stage of change. As such, her goal is to continue with weight loss efforts. She has agreed to a keto diet.   She will work on meal planning.  Labs were reviewed, including CMP, vitamin D, A1c, and insulin.  Exercise goals: For  substantial health benefits, adults should do at least 150 minutes (2 hours and 30 minutes) a week of moderate-intensity, or 75 minutes (1 hour and 15 minutes) a week of vigorous-intensity aerobic physical activity, or an equivalent combination of moderate- and vigorous-intensity aerobic activity. Aerobic activity should be performed in episodes of at least 10 minutes, and preferably, it should be spread throughout the week.  Behavioral modification strategies: increasing lean protein intake, decreasing simple carbohydrates, increasing vegetables, increasing water intake, decreasing eating out, no skipping meals, meal planning and cooking strategies, keeping healthy foods in the home and planning for success.  Jessica Drake has agreed to follow-up with our clinic in 2-3 weeks, fasting for IC. She was informed of the importance of frequent follow-up visits to maximize her success with intensive lifestyle modifications for her multiple health conditions.  Objective:   VITALS: Per patient if applicable, see vitals. GENERAL: Alert and in no acute distress. CARDIOPULMONARY: No increased WOB. Speaking in clear sentences.  PSYCH: Pleasant and cooperative. Speech normal rate and rhythm. Affect is appropriate. Insight and judgement are appropriate. Attention is focused, linear, and appropriate.  NEURO: Oriented as arrived to appointment on time with no prompting.   Lab Results  Component Value Date   CREATININE 1.01 (H) 10/10/2020   BUN 13 10/10/2020   NA 144 10/10/2020   K 4.8 10/10/2020   CL 104 10/10/2020   CO2 22 10/10/2020   Lab Results  Component Value Date   ALT 27 10/10/2020   AST 26 10/10/2020   ALKPHOS 52 10/10/2020  BILITOT 0.5 10/10/2020   Lab Results  Component Value Date   HGBA1C 5.4 10/10/2020   HGBA1C 5.7 (H) 04/22/2020   Lab Results  Component Value Date   INSULIN 6.0 10/10/2020   INSULIN 10.7 04/22/2020   Attestation Statements:   Reviewed by clinician on day of visit:  allergies, medications, problem list, medical history, surgical history, family history, social history, and previous encounter notes.  I, Water quality scientist, CMA, am acting as Location manager for [employer], DO  I have reviewed the above documentation for accuracy and completeness, and I agree with the above. Jearld Lesch, DO

## 2020-10-31 DIAGNOSIS — Z20822 Contact with and (suspected) exposure to covid-19: Secondary | ICD-10-CM | POA: Diagnosis not present

## 2020-11-12 ENCOUNTER — Other Ambulatory Visit: Payer: Self-pay

## 2020-11-12 ENCOUNTER — Ambulatory Visit (INDEPENDENT_AMBULATORY_CARE_PROVIDER_SITE_OTHER): Payer: BC Managed Care – PPO | Admitting: Bariatrics

## 2020-11-12 ENCOUNTER — Encounter (INDEPENDENT_AMBULATORY_CARE_PROVIDER_SITE_OTHER): Payer: Self-pay | Admitting: Bariatrics

## 2020-11-12 VITALS — BP 136/88 | HR 70 | Temp 97.9°F | Ht 68.0 in | Wt 213.0 lb

## 2020-11-12 DIAGNOSIS — Z6832 Body mass index (BMI) 32.0-32.9, adult: Secondary | ICD-10-CM | POA: Diagnosis not present

## 2020-11-12 DIAGNOSIS — E669 Obesity, unspecified: Secondary | ICD-10-CM

## 2020-11-12 DIAGNOSIS — R0602 Shortness of breath: Secondary | ICD-10-CM

## 2020-11-12 DIAGNOSIS — I10 Essential (primary) hypertension: Secondary | ICD-10-CM

## 2020-11-12 DIAGNOSIS — R5383 Other fatigue: Secondary | ICD-10-CM

## 2020-11-12 DIAGNOSIS — Z6833 Body mass index (BMI) 33.0-33.9, adult: Secondary | ICD-10-CM | POA: Diagnosis not present

## 2020-11-12 DIAGNOSIS — Z01419 Encounter for gynecological examination (general) (routine) without abnormal findings: Secondary | ICD-10-CM | POA: Diagnosis not present

## 2020-11-12 DIAGNOSIS — Z1231 Encounter for screening mammogram for malignant neoplasm of breast: Secondary | ICD-10-CM | POA: Diagnosis not present

## 2020-11-12 DIAGNOSIS — E66811 Obesity, class 1: Secondary | ICD-10-CM

## 2020-11-14 NOTE — Progress Notes (Signed)
Chief Complaint:   OBESITY Jessica Drake is here to discuss her progress with her obesity treatment plan along with follow-up of her obesity related diagnoses. Jessica Drake is on a keto diet and states she is following her eating plan approximately 80% of the time. Jessica Drake states she is not exercising regularly.  Today's visit was #: 11 Starting weight: 244 lbs Starting date: 04/22/2020 Today's weight: 213 lbs Today's date: 11/12/2020 Total lbs lost to date: 31 lbs Total lbs lost since last in-office visit: 11 lbs  Interim History: Jessica Drake is down 11 pounds since her last visit and is doing well overall.  Subjective:   1. Other fatigue and SOB RMR in 04/2020 was 1364 and RMR today is 1375.  2. Essential hypertension Reasonably well controlled.  Review: taking medications as instructed, no medication side effects noted, no chest pain on exertion, no dyspnea on exertion, no swelling of ankles.  She is taking Cozaar 50 mg daily.  BP Readings from Last 3 Encounters:  11/12/20 136/88  10/08/20 136/86  09/05/20 116/73   Assessment/Plan:   1. Other fatigue and SOB Will continue current plan.  Increase activity/exercise.  Discussed IC results.    2. Essential hypertension Jessica Drake is working on healthy weight loss and exercise to improve blood pressure control. We will watch for signs of hypotension as she continues her lifestyle modifications.  Continue medications.  3. Class 1 obesity with serious comorbidity and body mass index (BMI) of 32.0 to 32.9 in adult, unspecified obesity type  Jessica Drake is currently in the action stage of change. As such, her goal is to continue with weight loss efforts. She has agreed to a keto diet.   She will work on meal planning, will stick to her plan strictly, and will increase her water intake.  Exercise goals: Elliptical and will start back going to the gym.  Behavioral modification strategies: increasing lean protein intake, decreasing simple carbohydrates,  increasing vegetables, increasing water intake, decreasing eating out, no skipping meals, meal planning and cooking strategies, keeping healthy foods in the home, dealing with family or coworker sabotage, travel eating strategies, holiday eating strategies  and celebration eating strategies.  Jessica Drake has agreed to follow-up with our clinic in 2 weeks. She was informed of the importance of frequent follow-up visits to maximize her success with intensive lifestyle modifications for her multiple health conditions.   Objective:   Blood pressure 136/88, pulse 70, temperature 97.9 F (36.6 C), height 5\' 8"  (1.727 m), weight 213 lb (96.6 kg), SpO2 98 %. Body mass index is 32.39 kg/m.  General: Cooperative, alert, well developed, in no acute distress. HEENT: Conjunctivae and lids unremarkable. Cardiovascular: Regular rhythm.  Lungs: Normal work of breathing. Neurologic: No focal deficits.   Lab Results  Component Value Date   CREATININE 1.01 (H) 10/10/2020   BUN 13 10/10/2020   NA 144 10/10/2020   K 4.8 10/10/2020   CL 104 10/10/2020   CO2 22 10/10/2020   Lab Results  Component Value Date   ALT 27 10/10/2020   AST 26 10/10/2020   ALKPHOS 52 10/10/2020   BILITOT 0.5 10/10/2020   Lab Results  Component Value Date   HGBA1C 5.4 10/10/2020   HGBA1C 5.7 (H) 04/22/2020   Lab Results  Component Value Date   INSULIN 6.0 10/10/2020   INSULIN 10.7 04/22/2020   Attestation Statements:   Reviewed by clinician on day of visit: allergies, medications, problem list, medical history, surgical history, family history, social history, and previous encounter  notes.  Time spent on visit including pre-visit chart review and post-visit care and charting was 30 minutes.   I, Water quality scientist, CMA, am acting as Location manager for [employer], DO  I have reviewed the above documentation for accuracy and completeness, and I agree with the above. Jearld Lesch, DO

## 2020-11-19 ENCOUNTER — Encounter (INDEPENDENT_AMBULATORY_CARE_PROVIDER_SITE_OTHER): Payer: Self-pay | Admitting: Bariatrics

## 2020-12-02 ENCOUNTER — Other Ambulatory Visit: Payer: Self-pay

## 2020-12-02 ENCOUNTER — Encounter (INDEPENDENT_AMBULATORY_CARE_PROVIDER_SITE_OTHER): Payer: Self-pay | Admitting: Bariatrics

## 2020-12-02 ENCOUNTER — Ambulatory Visit (INDEPENDENT_AMBULATORY_CARE_PROVIDER_SITE_OTHER): Payer: BC Managed Care – PPO | Admitting: Bariatrics

## 2020-12-02 VITALS — BP 123/73 | HR 79 | Temp 97.9°F | Ht 68.0 in | Wt 210.0 lb

## 2020-12-02 DIAGNOSIS — I10 Essential (primary) hypertension: Secondary | ICD-10-CM | POA: Diagnosis not present

## 2020-12-02 DIAGNOSIS — E559 Vitamin D deficiency, unspecified: Secondary | ICD-10-CM | POA: Diagnosis not present

## 2020-12-02 DIAGNOSIS — Z6830 Body mass index (BMI) 30.0-30.9, adult: Secondary | ICD-10-CM | POA: Diagnosis not present

## 2020-12-02 DIAGNOSIS — E6609 Other obesity due to excess calories: Secondary | ICD-10-CM

## 2020-12-03 NOTE — Progress Notes (Signed)
Chief Complaint:   OBESITY Jessica Drake is here to discuss her progress with her obesity treatment plan along with follow-up of her obesity related diagnoses. Jessica Drake is on Keto diet and states she is following her eating plan approximately 75% of the time. Jessica Drake states she is at the gym lifting weights for 30 minutes 3 times per week.  Today's visit was #: 12 Starting weight: 244 lbs Starting date: 04/22/2020 Today's weight: 210 lbs Today's date: 12/02/2020 Total lbs lost to date: 34 Total lbs lost since last in-office visit: 3  Interim History: Jessica Drake is down 3 lbs since her last visit, and she has done well overall. She has attended many celebrations.  Subjective:   1. Essential hypertension Jessica Drake is taking Cozaar, and her blood pressure is controlled.  2. Vitamin D insufficiency Jessica Drake is taking Vit D OTC. Last Vit D level was 50.0.  Assessment/Plan:   1. Essential hypertension Jessica Drake will continue Cozaar, and she will continue working on healthy weight loss and exercise to improve blood pressure control. We will watch for signs of hypotension as she continues her lifestyle modifications.  2. Vitamin D insufficiency Low Vitamin D level contributes to fatigue and are associated with obesity, breast, and colon cancer. Jessica Drake agreed to continue taking OTC Vitamin D and will follow-up for routine testing of Vitamin D, at least 2-3 times per year to avoid over-replacement.  3. Class 1 obesity due to excess calories with serious comorbidity and body mass index (BMI) of 30.0 to 30.9 in adult Jessica Drake is currently in the action stage of change. As such, her goal is to continue with weight loss efforts. She has agreed to following a lower carbohydrate, vegetable and lean protein rich diet plan.   Intentional eating, and making good choices was discussed.  Exercise goals: As is.  Behavioral modification strategies: increasing lean protein intake, decreasing simple carbohydrates,  increasing vegetables, increasing water intake, decreasing eating out, no skipping meals, meal planning and cooking strategies, keeping healthy foods in the home and planning for success.  Jessica Drake has agreed to follow-up with our clinic in 2 weeks. She was informed of the importance of frequent follow-up visits to maximize her success with intensive lifestyle modifications for her multiple health conditions.   Objective:   Blood pressure 123/73, pulse 79, temperature 97.9 F (36.6 C), temperature source Oral, height 5\' 8"  (1.727 m), weight 210 lb (95.3 kg), SpO2 98 %. Body mass index is 31.93 kg/m.  General: Cooperative, alert, well developed, in no acute distress. HEENT: Conjunctivae and lids unremarkable. Cardiovascular: Regular rhythm.  Lungs: Normal work of breathing. Neurologic: No focal deficits.   Lab Results  Component Value Date   CREATININE 1.01 (H) 10/10/2020   BUN 13 10/10/2020   NA 144 10/10/2020   K 4.8 10/10/2020   CL 104 10/10/2020   CO2 22 10/10/2020   Lab Results  Component Value Date   ALT 27 10/10/2020   AST 26 10/10/2020   ALKPHOS 52 10/10/2020   BILITOT 0.5 10/10/2020   Lab Results  Component Value Date   HGBA1C 5.4 10/10/2020   HGBA1C 5.7 (H) 04/22/2020   Lab Results  Component Value Date   INSULIN 6.0 10/10/2020   INSULIN 10.7 04/22/2020   No results found for: TSH No results found for: CHOL, HDL, LDLCALC, LDLDIRECT, TRIG, CHOLHDL No results found for: WBC, HGB, HCT, MCV, PLT No results found for: IRON, TIBC, FERRITIN  Attestation Statements:   Reviewed by clinician on day of visit:  allergies, medications, problem list, medical history, surgical history, family history, social history, and previous encounter notes.  Time spent on visit including pre-visit chart review and post-visit care and charting was 20 minutes.    Wilhemena Durie, am acting as Location manager for [employer], DO.  I have reviewed the above documentation for  accuracy and completeness, and I agree with the above. Jearld Lesch, DO

## 2020-12-04 ENCOUNTER — Encounter (INDEPENDENT_AMBULATORY_CARE_PROVIDER_SITE_OTHER): Payer: Self-pay | Admitting: Bariatrics

## 2020-12-13 DIAGNOSIS — L821 Other seborrheic keratosis: Secondary | ICD-10-CM | POA: Diagnosis not present

## 2020-12-13 DIAGNOSIS — L814 Other melanin hyperpigmentation: Secondary | ICD-10-CM | POA: Diagnosis not present

## 2020-12-13 DIAGNOSIS — D225 Melanocytic nevi of trunk: Secondary | ICD-10-CM | POA: Diagnosis not present

## 2020-12-13 DIAGNOSIS — L820 Inflamed seborrheic keratosis: Secondary | ICD-10-CM | POA: Diagnosis not present

## 2020-12-13 DIAGNOSIS — D229 Melanocytic nevi, unspecified: Secondary | ICD-10-CM | POA: Diagnosis not present

## 2020-12-26 ENCOUNTER — Other Ambulatory Visit: Payer: Self-pay

## 2020-12-26 ENCOUNTER — Encounter (INDEPENDENT_AMBULATORY_CARE_PROVIDER_SITE_OTHER): Payer: Self-pay | Admitting: Bariatrics

## 2020-12-26 ENCOUNTER — Ambulatory Visit (INDEPENDENT_AMBULATORY_CARE_PROVIDER_SITE_OTHER): Payer: BC Managed Care – PPO | Admitting: Bariatrics

## 2020-12-26 VITALS — BP 129/80 | HR 70 | Temp 98.7°F | Ht 68.0 in | Wt 204.0 lb

## 2020-12-26 DIAGNOSIS — I10 Essential (primary) hypertension: Secondary | ICD-10-CM

## 2020-12-26 DIAGNOSIS — E669 Obesity, unspecified: Secondary | ICD-10-CM

## 2020-12-26 DIAGNOSIS — R7303 Prediabetes: Secondary | ICD-10-CM

## 2020-12-26 DIAGNOSIS — Z6834 Body mass index (BMI) 34.0-34.9, adult: Secondary | ICD-10-CM | POA: Diagnosis not present

## 2020-12-27 DIAGNOSIS — I8311 Varicose veins of right lower extremity with inflammation: Secondary | ICD-10-CM | POA: Diagnosis not present

## 2020-12-27 DIAGNOSIS — I8312 Varicose veins of left lower extremity with inflammation: Secondary | ICD-10-CM | POA: Diagnosis not present

## 2020-12-31 NOTE — Progress Notes (Signed)
Chief Complaint:   OBESITY Jessica Drake is here to discuss her progress with her obesity treatment plan along with follow-up of her obesity related diagnoses. Jessica Drake is on following a lower carbohydrate, vegetable and lean protein rich diet plan and states she is following her eating plan approximately 100% of the time. Jessica Drake states she is doing weights for 60 minutes 4 times per week.  Today's visit was #: 50 Starting weight: 244 lbs Starting date: 04/22/2020 Today's weight: 204 lbs Today's date: 12/26/2020 Total lbs lost to date: 40 Total lbs lost since last in-office visit: 6  Interim History: Jessica Drake is down another 6 lbs and she is doing well overall. She has settled into her Low carbohydrate diet.  Subjective:   1. Pre-diabetes Jessica Drake's last A1c was 5.4, down from 5.7 previously.  2. Essential hypertension Jessica Drake's blood pressure is controlled.  Assessment/Plan:   1. Pre-diabetes Jessica Drake will continue her Low carbohydrate plan, and will continue to work on weight loss, exercise, and decreasing simple carbohydrates to help decrease the risk of diabetes.   2. Essential hypertension Jessica Drake will continue her medications, and will continue working on healthy weight loss and exercise to improve blood pressure control. We will watch for signs of hypotension as she continues her lifestyle modifications.  3. Obesity w/current BMI 31 Jessica Drake is currently in the action stage of change. As such, her goal is to continue with weight loss efforts. She has agreed to following a lower carbohydrate, vegetable and lean protein rich diet plan.   Jessica Drake will continue to adhere to the plan. Recipes II handout was given.  We will recheck fasting labs at her next visit.  Exercise goals: As is.  Behavioral modification strategies: increasing lean protein intake, decreasing simple carbohydrates, increasing vegetables, increasing water intake, decreasing eating out, no skipping meals, meal planning  and cooking strategies, keeping healthy foods in the home, dealing with family or coworker sabotage, travel eating strategies, holiday eating strategies  and celebration eating strategies.  Jessica Drake has agreed to follow-up with our clinic in 2 weeks. She was informed of the importance of frequent follow-up visits to maximize her success with intensive lifestyle modifications for her multiple health conditions.   Objective:   Blood pressure 129/80, pulse 70, temperature 98.7 F (37.1 C), height 5\' 8"  (1.727 m), weight 204 lb (92.5 kg), SpO2 98 %. Body mass index is 31.02 kg/m.  General: Cooperative, alert, well developed, in no acute distress. HEENT: Conjunctivae and lids unremarkable. Cardiovascular: Regular rhythm.  Lungs: Normal work of breathing. Neurologic: No focal deficits.   Lab Results  Component Value Date   CREATININE 1.01 (H) 10/10/2020   BUN 13 10/10/2020   NA 144 10/10/2020   K 4.8 10/10/2020   CL 104 10/10/2020   CO2 22 10/10/2020   Lab Results  Component Value Date   ALT 27 10/10/2020   AST 26 10/10/2020   ALKPHOS 52 10/10/2020   BILITOT 0.5 10/10/2020   Lab Results  Component Value Date   HGBA1C 5.4 10/10/2020   HGBA1C 5.7 (H) 04/22/2020   Lab Results  Component Value Date   INSULIN 6.0 10/10/2020   INSULIN 10.7 04/22/2020   No results found for: TSH No results found for: CHOL, HDL, LDLCALC, LDLDIRECT, TRIG, CHOLHDL No results found for: WBC, HGB, HCT, MCV, PLT No results found for: IRON, TIBC, FERRITIN  Attestation Statements:   Reviewed by clinician on day of visit: allergies, medications, problem list, medical history, surgical history, family history, social history,  and previous encounter notes.  Time spent on visit including pre-visit chart review and post-visit care and charting was 20 minutes.    Wilhemena Durie, am acting as Location manager for [employer], DO.  I have reviewed the above documentation for accuracy and completeness, and  I agree with the above. Jearld Lesch, DO

## 2021-01-01 ENCOUNTER — Encounter (INDEPENDENT_AMBULATORY_CARE_PROVIDER_SITE_OTHER): Payer: Self-pay | Admitting: Bariatrics

## 2021-01-16 ENCOUNTER — Ambulatory Visit (INDEPENDENT_AMBULATORY_CARE_PROVIDER_SITE_OTHER): Payer: BC Managed Care – PPO | Admitting: Bariatrics

## 2021-01-16 ENCOUNTER — Other Ambulatory Visit: Payer: Self-pay

## 2021-01-16 ENCOUNTER — Encounter (INDEPENDENT_AMBULATORY_CARE_PROVIDER_SITE_OTHER): Payer: Self-pay | Admitting: Bariatrics

## 2021-01-16 VITALS — BP 129/76 | HR 69 | Temp 98.2°F | Ht 68.0 in | Wt 202.0 lb

## 2021-01-16 DIAGNOSIS — I8312 Varicose veins of left lower extremity with inflammation: Secondary | ICD-10-CM | POA: Diagnosis not present

## 2021-01-16 DIAGNOSIS — R7303 Prediabetes: Secondary | ICD-10-CM

## 2021-01-16 DIAGNOSIS — I10 Essential (primary) hypertension: Secondary | ICD-10-CM | POA: Diagnosis not present

## 2021-01-16 DIAGNOSIS — Z6837 Body mass index (BMI) 37.0-37.9, adult: Secondary | ICD-10-CM

## 2021-01-16 DIAGNOSIS — E559 Vitamin D deficiency, unspecified: Secondary | ICD-10-CM | POA: Diagnosis not present

## 2021-01-16 DIAGNOSIS — Z9189 Other specified personal risk factors, not elsewhere classified: Secondary | ICD-10-CM | POA: Insufficient documentation

## 2021-01-16 DIAGNOSIS — E66812 Obesity, class 2: Secondary | ICD-10-CM

## 2021-01-16 DIAGNOSIS — I8311 Varicose veins of right lower extremity with inflammation: Secondary | ICD-10-CM | POA: Diagnosis not present

## 2021-01-20 ENCOUNTER — Encounter (INDEPENDENT_AMBULATORY_CARE_PROVIDER_SITE_OTHER): Payer: Self-pay | Admitting: Bariatrics

## 2021-01-20 NOTE — Progress Notes (Signed)
Chief Complaint:   OBESITY Jessica Drake is here to discuss her progress with her obesity treatment plan along with follow-up of her obesity related diagnoses. Jessica Drake is on following a lower carbohydrate, vegetable and lean protein rich diet plan and states she is following her eating plan approximately 40% of the time. Jessica Drake is not exercising at this time.   Today's visit was #: 14 Starting weight: 244 lbs Starting date: 04/22/2020 Today's weight: 202 lbs Today's date: 01/16/2021 Total lbs lost to date: 42 lbs Total lbs lost since last in-office visit: 2  Interim History: Jessica Drake is down 2 lbs since her last visit. Her goal is 175 lbs.  Subjective:   1. Vitamin D insufficiency Jessica Drake is taking OTC calcium and Vit D.  2. Essential hypertension Jessica Drake is taking meds as directed. Her BP is controlled.   BP Readings from Last 3 Encounters:  01/16/21 129/76  12/26/20 129/80  12/02/20 123/73   3. Pre-diabetes Jessica Drake is not on medication.   Lab Results  Component Value Date   HGBA1C 5.4 10/10/2020   Lab Results  Component Value Date   INSULIN 6.0 10/10/2020   INSULIN 10.7 04/22/2020    Assessment/Plan:   1. Vitamin D insufficiency Low Vitamin D level contributes to fatigue and are associated with obesity, breast, and colon cancer. She agrees to continue to take OTC Calcium and Vitamin D daily and will follow-up for routine testing of Vitamin D, at least 2-3 times per year to avoid over-replacement.  2. Essential hypertension Jessica Drake is working on healthy weight loss and exercise to improve blood pressure control. We will watch for signs of hypotension as she continues her lifestyle modifications. Continue current treatment plan.  3. Pre-diabetes Jessica Drake will continue to work on weight loss, exercise, and decreasing simple carbohydrates to help decrease the risk of diabetes.   4. Obesity with current BMI 30.8 Jessica Drake is currently in the action stage of change. As such, her  goal is to continue with weight loss efforts. She has agreed to following a lower carbohydrate, vegetable and lean protein rich diet plan (lean protein).   Continue low carb Increase protein and water Mindful eating  Exercise goals: As is  Behavioral modification strategies: increasing lean protein intake, decreasing simple carbohydrates, increasing vegetables, increasing water intake, decreasing eating out, no skipping meals, meal planning and cooking strategies, keeping healthy foods in the home and planning for success.  Jessica Drake has agreed to follow-up with our clinic in 2-3 weeks. She was informed of the importance of frequent follow-up visits to maximize her success with intensive lifestyle modifications for her multiple health conditions.   Objective:   Blood pressure 129/76, pulse 69, temperature 98.2 F (36.8 C), height 5\' 8"  (1.727 m), weight 202 lb (91.6 kg), SpO2 98 %. Body mass index is 30.71 kg/m.  General: Cooperative, alert, well developed, in no acute distress. HEENT: Conjunctivae and lids unremarkable. Cardiovascular: Regular rhythm.  Lungs: Normal work of breathing. Neurologic: No focal deficits.   Lab Results  Component Value Date   CREATININE 1.01 (H) 10/10/2020   BUN 13 10/10/2020   NA 144 10/10/2020   K 4.8 10/10/2020   CL 104 10/10/2020   CO2 22 10/10/2020   Lab Results  Component Value Date   ALT 27 10/10/2020   AST 26 10/10/2020   ALKPHOS 52 10/10/2020   BILITOT 0.5 10/10/2020   Lab Results  Component Value Date   HGBA1C 5.4 10/10/2020   HGBA1C 5.7 (H) 04/22/2020  Lab Results  Component Value Date   INSULIN 6.0 10/10/2020   INSULIN 10.7 04/22/2020   No results found for: TSH No results found for: CHOL, HDL, LDLCALC, LDLDIRECT, TRIG, CHOLHDL No results found for: WBC, HGB, HCT, MCV, PLT No results found for: IRON, TIBC, FERRITIN   Attestation Statements:   Reviewed by clinician on day of visit: allergies, medications, problem list,  medical history, surgical history, family history, social history, and previous encounter notes.  Coral Ceo, am acting as Location manager for [employer], DO.  I have reviewed the above documentation for accuracy and completeness, and I agree with the above. Jearld Lesch, DO

## 2021-02-04 ENCOUNTER — Ambulatory Visit (INDEPENDENT_AMBULATORY_CARE_PROVIDER_SITE_OTHER): Payer: BC Managed Care – PPO | Admitting: Bariatrics

## 2021-02-04 ENCOUNTER — Other Ambulatory Visit: Payer: Self-pay

## 2021-02-04 ENCOUNTER — Encounter (INDEPENDENT_AMBULATORY_CARE_PROVIDER_SITE_OTHER): Payer: Self-pay | Admitting: Bariatrics

## 2021-02-04 VITALS — BP 127/75 | HR 68 | Temp 97.6°F | Ht 68.0 in | Wt 203.0 lb

## 2021-02-04 DIAGNOSIS — E7800 Pure hypercholesterolemia, unspecified: Secondary | ICD-10-CM | POA: Diagnosis not present

## 2021-02-04 DIAGNOSIS — Z6834 Body mass index (BMI) 34.0-34.9, adult: Secondary | ICD-10-CM

## 2021-02-04 DIAGNOSIS — Z9189 Other specified personal risk factors, not elsewhere classified: Secondary | ICD-10-CM

## 2021-02-04 DIAGNOSIS — I10 Essential (primary) hypertension: Secondary | ICD-10-CM

## 2021-02-04 DIAGNOSIS — R7303 Prediabetes: Secondary | ICD-10-CM | POA: Diagnosis not present

## 2021-02-04 DIAGNOSIS — E559 Vitamin D deficiency, unspecified: Secondary | ICD-10-CM

## 2021-02-04 DIAGNOSIS — E669 Obesity, unspecified: Secondary | ICD-10-CM

## 2021-02-05 DIAGNOSIS — I8311 Varicose veins of right lower extremity with inflammation: Secondary | ICD-10-CM | POA: Diagnosis not present

## 2021-02-05 DIAGNOSIS — I8312 Varicose veins of left lower extremity with inflammation: Secondary | ICD-10-CM | POA: Diagnosis not present

## 2021-02-05 LAB — VITAMIN D 25 HYDROXY (VIT D DEFICIENCY, FRACTURES): Vit D, 25-Hydroxy: 71.5 ng/mL (ref 30.0–100.0)

## 2021-02-05 LAB — COMPREHENSIVE METABOLIC PANEL
ALT: 15 IU/L (ref 0–32)
AST: 20 IU/L (ref 0–40)
Albumin/Globulin Ratio: 1.9 (ref 1.2–2.2)
Albumin: 4.1 g/dL (ref 3.8–4.8)
Alkaline Phosphatase: 56 IU/L (ref 44–121)
BUN/Creatinine Ratio: 19 (ref 12–28)
BUN: 17 mg/dL (ref 8–27)
Bilirubin Total: 0.4 mg/dL (ref 0.0–1.2)
CO2: 21 mmol/L (ref 20–29)
Calcium: 9.3 mg/dL (ref 8.7–10.3)
Chloride: 105 mmol/L (ref 96–106)
Creatinine, Ser: 0.91 mg/dL (ref 0.57–1.00)
Globulin, Total: 2.2 g/dL (ref 1.5–4.5)
Glucose: 95 mg/dL (ref 65–99)
Potassium: 4.5 mmol/L (ref 3.5–5.2)
Sodium: 141 mmol/L (ref 134–144)
Total Protein: 6.3 g/dL (ref 6.0–8.5)
eGFR: 72 mL/min/{1.73_m2} (ref >59)

## 2021-02-05 LAB — INSULIN, RANDOM: INSULIN: 6.8 u[IU]/mL (ref 2.6–24.9)

## 2021-02-05 LAB — LIPID PANEL WITH LDL/HDL RATIO
Cholesterol, Total: 206 mg/dL — ABNORMAL HIGH (ref 100–199)
HDL: 76 mg/dL (ref >39)
LDL Chol Calc (NIH): 118 mg/dL — ABNORMAL HIGH (ref 0–99)
LDL/HDL Ratio: 1.6 ratio (ref 0.0–3.2)
Triglycerides: 65 mg/dL (ref 0–149)
VLDL Cholesterol Cal: 12 mg/dL (ref 5–40)

## 2021-02-05 LAB — HEMOGLOBIN A1C
Est. average glucose Bld gHb Est-mCnc: 108 mg/dL
Hgb A1c MFr Bld: 5.4 % (ref 4.8–5.6)

## 2021-02-05 NOTE — Progress Notes (Signed)
Chief Complaint:   OBESITY Jessica Drake is here to discuss her progress with her obesity treatment plan along with follow-up of her obesity related diagnoses. Jessica Drake is on following a lower carbohydrate, vegetable and lean protein rich diet plan and states she is following her eating plan approximately 75% of the time. Jessica Drake states she is doing cardio for 60 minutes 2 times per week.  Today's visit was #: 15 Starting weight: 244 lbs Starting date: 04/22/2020 Today's weight: 203 lbs Today's date: 02/04/2021 Total lbs lost to date: 41 Total lbs lost since last in-office visit: 0  Interim History: Jessica Drake is up 1 lb but she has done very well overall. Se has had more celebrations.  Subjective:   1. Essential hypertension Nella's blood pressure is controlled.  2. Vitamin D insufficiency Jessica Drake is taking OTC medications.  3. Pre-diabetes Jessica Drake's A1c was previously 5.7 on 04/17/2020.  4. Elevated cholesterol Jessica Drake is not currently on medications.  5. At risk for heart disease Jessica Drake is at a higher than average risk for cardiovascular disease due to obesity.   Assessment/Plan:   1. Essential hypertension Jessica Drake will continue her medications, and will continue working on healthy weight loss, exercise, and no added salt to improve blood pressure control. We will watch for signs of hypotension as she continues her lifestyle modifications. We will check labs today.  - Comprehensive metabolic panel  2. Vitamin D insufficiency Low Vitamin D level contributes to fatigue and are associated with obesity, breast, and colon cancer. We will check labs today. Jessica Drake will continue OTC Vitamin D and will follow-up for routine testing of Vitamin D, at least 2-3 times per year to avoid over-replacement.  - VITAMIN D 25 Hydroxy (Vit-D Deficiency, Fractures)  3. Pre-diabetes Jessica Drake will continue to work on weight loss, exercise, and decreasing simple carbohydrates to help decrease the risk of  diabetes. We will check labs today.  - Insulin, random - Hemoglobin A1c  4. Elevated cholesterol Cardiovascular risk and specific lipid/LDL goals reviewed. We discussed several lifestyle modifications today. We will check labs today. Jessica Drake will continue to work on diet, exercise and weight loss efforts. Orders and follow up as documented in patient record.   Counseling Intensive lifestyle modifications are the first line treatment for this issue. . Dietary changes: Increase soluble fiber. Decrease simple carbohydrates. . Exercise changes: Moderate to vigorous-intensity aerobic activity 150 minutes per week if tolerated. . Lipid-lowering medications: see documented in medical record.  - Comprehensive metabolic panel - Lipid Panel With LDL/HDL Ratio  5. At risk for heart disease Jessica Drake was given approximately 15 minutes of coronary artery disease prevention counseling today. She is 60 y.o. female and has risk factors for heart disease including obesity. We discussed intensive lifestyle modifications today with an emphasis on specific weight loss instructions and strategies.   Repetitive spaced learning was employed today to elicit superior memory formation and behavioral change.  6. Obesity with current BMI 30 Jessica Drake is currently in the action stage of change. As such, her goal is to continue with weight loss efforts. She has agreed to following a lower carbohydrate, vegetable and lean protein rich diet plan.   Jessica Drake will adhere closely to her meal plan.   Exercise goals: As is.  Behavioral modification strategies: increasing lean protein intake, decreasing simple carbohydrates, increasing vegetables, increasing water intake, decreasing eating out, no skipping meals, meal planning and cooking strategies, keeping healthy foods in the home, avoiding temptations and planning for success.  Jessica Drake has agreed  to follow-up with our clinic in 2 to 3 weeks. She was informed of the importance of  frequent follow-up visits to maximize her success with intensive lifestyle modifications for her multiple health conditions.   Jessica Drake was informed we would discuss her lab results at her next visit unless there is a critical issue that needs to be addressed sooner. Jessica Drake agreed to keep her next visit at the agreed upon time to discuss these results.  Objective:   Blood pressure 127/75, pulse 68, temperature 97.6 F (36.4 C), height 5\' 8"  (1.727 m), weight 203 lb (92.1 kg), SpO2 98 %. Body mass index is 30.87 kg/m.  General: Cooperative, alert, well developed, in no acute distress. HEENT: Conjunctivae and lids unremarkable. Cardiovascular: Regular rhythm.  Lungs: Normal work of breathing. Neurologic: No focal deficits.   Lab Results  Component Value Date   CREATININE 0.91 02/04/2021   BUN 17 02/04/2021   NA 141 02/04/2021   K 4.5 02/04/2021   CL 105 02/04/2021   CO2 21 02/04/2021   Lab Results  Component Value Date   ALT 15 02/04/2021   AST 20 02/04/2021   ALKPHOS 56 02/04/2021   BILITOT 0.4 02/04/2021   Lab Results  Component Value Date   HGBA1C 5.4 02/04/2021   HGBA1C 5.4 10/10/2020   HGBA1C 5.7 (H) 04/22/2020   Lab Results  Component Value Date   INSULIN 6.8 02/04/2021   INSULIN 6.0 10/10/2020   INSULIN 10.7 04/22/2020   No results found for: TSH Lab Results  Component Value Date   CHOL 206 (H) 02/04/2021   HDL 76 02/04/2021   LDLCALC 118 (H) 02/04/2021   TRIG 65 02/04/2021   No results found for: WBC, HGB, HCT, MCV, PLT No results found for: IRON, TIBC, FERRITIN  Attestation Statements:   Reviewed by clinician on day of visit: allergies, medications, problem list, medical history, surgical history, family history, social history, and previous encounter notes.   Wilhemena Durie, am acting as Location manager for [employer], DO.  I have reviewed the above documentation for accuracy and completeness, and I agree with the above. Jearld Lesch,  DO

## 2021-02-13 DIAGNOSIS — H00011 Hordeolum externum right upper eyelid: Secondary | ICD-10-CM | POA: Diagnosis not present

## 2021-02-24 ENCOUNTER — Encounter (INDEPENDENT_AMBULATORY_CARE_PROVIDER_SITE_OTHER): Payer: Self-pay | Admitting: Bariatrics

## 2021-02-24 ENCOUNTER — Other Ambulatory Visit: Payer: Self-pay

## 2021-02-24 ENCOUNTER — Ambulatory Visit (INDEPENDENT_AMBULATORY_CARE_PROVIDER_SITE_OTHER): Payer: BC Managed Care – PPO | Admitting: Bariatrics

## 2021-02-24 VITALS — BP 136/88 | HR 61 | Temp 98.4°F | Ht 68.0 in | Wt 206.0 lb

## 2021-02-24 DIAGNOSIS — I10 Essential (primary) hypertension: Secondary | ICD-10-CM

## 2021-02-24 DIAGNOSIS — Z6834 Body mass index (BMI) 34.0-34.9, adult: Secondary | ICD-10-CM

## 2021-02-24 DIAGNOSIS — E669 Obesity, unspecified: Secondary | ICD-10-CM

## 2021-02-24 DIAGNOSIS — R7303 Prediabetes: Secondary | ICD-10-CM

## 2021-02-26 NOTE — Progress Notes (Signed)
Chief Complaint:   OBESITY Jessica Drake is here to discuss her progress with her obesity treatment plan along with follow-up of her obesity related diagnoses. Jessica Drake is on following a lower carbohydrate, vegetable and lean protein rich diet plan and states she is following her eating plan approximately 30% of the time. Jessica Drake states she is not currently exercising.  Today's visit was #: 65 Starting weight: 244 lbs Starting date: 04/22/2020 Today's weight: 206 lbs Today's date: 02/24/2021 Total lbs lost to date: 38 Total lbs lost since last in-office visit: 0  Interim History: Jessica Drake is up 3 lbs but has done well overall. She has been on vacation. She is doing well with protein.  Subjective:   1. Pre-diabetes Jessica Drake is not on medication.  2. Essential hypertension Jessica Drake's BP is controlled.   BP Readings from Last 3 Encounters:  02/24/21 136/88  02/04/21 127/75  01/16/21 129/76    Assessment/Plan:   1. Pre-diabetes Jessica Drake will continue to work on weight loss, exercise, and decreasing simple carbohydrates to help decrease the risk of diabetes. Increase healthy fats and protein.  2. Essential hypertension Jessica Drake is working on healthy weight loss and exercise to improve blood pressure control. We will watch for signs of hypotension as she continues her lifestyle modifications. Continue current treatment plan.  3. Obesity with current BMI 31 Jessica Drake is currently in the action stage of change. As such, her goal is to continue with weight loss efforts. She has agreed to following a lower carbohydrate, vegetable and lean protein rich diet plan.   Meal plan Intentional eating Increase water  Exercise goals: Will consider more exercising.  Behavioral modification strategies: increasing lean protein intake, decreasing simple carbohydrates, increasing vegetables, increasing water intake, decreasing eating out, no skipping meals, meal planning and cooking strategies, keeping healthy  foods in the home and planning for success.  Jessica Drake has agreed to follow-up with our clinic in 3 weeks. She was informed of the importance of frequent follow-up visits to maximize her success with intensive lifestyle modifications for her multiple health conditions.   Objective:   Blood pressure 136/88, pulse 61, temperature 98.4 F (36.9 C), height 5\' 8"  (1.727 m), weight 206 lb (93.4 kg), SpO2 98 %. Body mass index is 31.32 kg/m.  General: Cooperative, alert, well developed, in no acute distress. HEENT: Conjunctivae and lids unremarkable. Cardiovascular: Regular rhythm.  Lungs: Normal work of breathing. Neurologic: No focal deficits.   Lab Results  Component Value Date   CREATININE 0.91 02/04/2021   BUN 17 02/04/2021   NA 141 02/04/2021   K 4.5 02/04/2021   CL 105 02/04/2021   CO2 21 02/04/2021   Lab Results  Component Value Date   ALT 15 02/04/2021   AST 20 02/04/2021   ALKPHOS 56 02/04/2021   BILITOT 0.4 02/04/2021   Lab Results  Component Value Date   HGBA1C 5.4 02/04/2021   HGBA1C 5.4 10/10/2020   HGBA1C 5.7 (H) 04/22/2020   Lab Results  Component Value Date   INSULIN 6.8 02/04/2021   INSULIN 6.0 10/10/2020   INSULIN 10.7 04/22/2020   No results found for: TSH Lab Results  Component Value Date   CHOL 206 (H) 02/04/2021   HDL 76 02/04/2021   LDLCALC 118 (H) 02/04/2021   TRIG 65 02/04/2021   No results found for: WBC, HGB, HCT, MCV, PLT No results found for: IRON, TIBC, FERRITIN   Attestation Statements:   Reviewed by clinician on day of visit: allergies, medications, problem list, medical  history, surgical history, family history, social history, and previous encounter notes.  Time spent on visit including pre-visit chart review and post-visit care and charting was 20 minutes.   Coral Ceo, CMA, am acting as Location manager for [employer], DO.  I have reviewed the above documentation for accuracy and completeness, and I agree with the  above. Jearld Lesch, DO

## 2021-02-27 ENCOUNTER — Encounter (INDEPENDENT_AMBULATORY_CARE_PROVIDER_SITE_OTHER): Payer: Self-pay | Admitting: Bariatrics

## 2021-03-19 ENCOUNTER — Ambulatory Visit (INDEPENDENT_AMBULATORY_CARE_PROVIDER_SITE_OTHER): Payer: BC Managed Care – PPO | Admitting: Bariatrics

## 2021-03-20 ENCOUNTER — Encounter (INDEPENDENT_AMBULATORY_CARE_PROVIDER_SITE_OTHER): Payer: Self-pay | Admitting: Family Medicine

## 2021-03-20 ENCOUNTER — Ambulatory Visit (INDEPENDENT_AMBULATORY_CARE_PROVIDER_SITE_OTHER): Payer: BC Managed Care – PPO | Admitting: Family Medicine

## 2021-03-20 ENCOUNTER — Other Ambulatory Visit: Payer: Self-pay

## 2021-03-20 VITALS — BP 152/75 | HR 72 | Temp 98.2°F | Ht 68.0 in | Wt 202.0 lb

## 2021-03-20 DIAGNOSIS — I10 Essential (primary) hypertension: Secondary | ICD-10-CM

## 2021-03-20 DIAGNOSIS — Z6837 Body mass index (BMI) 37.0-37.9, adult: Secondary | ICD-10-CM | POA: Diagnosis not present

## 2021-03-31 NOTE — Progress Notes (Signed)
Chief Complaint:   OBESITY Jessica Drake is here to discuss her progress with her obesity treatment plan along with follow-up of her obesity related diagnoses. Jessica Drake is on following a lower carbohydrate, vegetable and lean protein rich diet plan and states she is following her eating plan approximately 60% of the time. Jessica Drake states she is doing cardio on elliptical 20 minutes 7 times per week.  Today's visit was #: 85 Starting weight: 244 lbs Starting date: 04/22/2020 Today's weight: 202 lbs Today's date: 03/20/2021 Total lbs lost to date: 42 Total lbs lost since last in-office visit: 4  Interim History: Jessica Drake has been more strict with meal plan and working c 1 week. Plan is working well. She has more energy now and is less achy/sluggish.   Assessment/Plan:   1. Essential hypertension Not at goal. Medications: Losartan. Baby does not check her BP at home. No new concerns or symptoms. "I'm not too concerned about it."  Plan: Avoid buying foods that are: processed, frozen, or prepackaged to avoid excess salt. Ambulatory blood pressure monitoring was encouraged with a goal of at least 2-3 times weekly or when feeling poorly.  She was instructed to keep a log for [place] to review at each office visit. Increase exercise. Continue current medications.    BP Readings from Last 3 Encounters:  03/20/21 (!) 152/75  02/24/21 136/88  02/04/21 127/75   Lab Results  Component Value Date   CREATININE 0.91 02/04/2021   2. Obesity with current BMI of 30.8  Jessica Drake is currently in the action stage of change. As such, her goal is to continue with weight loss efforts. She has agreed to following a lower carbohydrate, vegetable and lean protein rich diet plan.   Exercise goals:  As is and Add 2 days a week of strength training for 20-30 minutes.  Behavioral modification strategies: planning for success.  Jessica Drake has agreed to follow-up with our clinic in 3 weeks. She was informed of the importance  of frequent follow-up visits to maximize her success with intensive lifestyle modifications for her multiple health conditions.   Objective:   Blood pressure (!) 152/75, pulse 72, temperature 98.2 F (36.8 C), height 5\' 8"  (1.727 m), weight 202 lb (91.6 kg), SpO2 98 %. Body mass index is 30.71 kg/m.  General: Cooperative, alert, well developed, in no acute distress. HEENT: Conjunctivae and lids unremarkable. Cardiovascular: Regular rhythm.  Lungs: Normal work of breathing. Neurologic: No focal deficits.   Lab Results  Component Value Date   CREATININE 0.91 02/04/2021   BUN 17 02/04/2021   NA 141 02/04/2021   K 4.5 02/04/2021   CL 105 02/04/2021   CO2 21 02/04/2021   Lab Results  Component Value Date   ALT 15 02/04/2021   AST 20 02/04/2021   ALKPHOS 56 02/04/2021   BILITOT 0.4 02/04/2021   Lab Results  Component Value Date   HGBA1C 5.4 02/04/2021   HGBA1C 5.4 10/10/2020   HGBA1C 5.7 (H) 04/22/2020   Lab Results  Component Value Date   INSULIN 6.8 02/04/2021   INSULIN 6.0 10/10/2020   INSULIN 10.7 04/22/2020   No results found for: TSH Lab Results  Component Value Date   CHOL 206 (H) 02/04/2021   HDL 76 02/04/2021   LDLCALC 118 (H) 02/04/2021   TRIG 65 02/04/2021   No results found for: WBC, HGB, HCT, MCV, PLT No results found for: IRON, TIBC, FERRITIN  Attestation Statements:   Reviewed by clinician on day of visit: allergies,  medications, problem list, medical history, surgical history, family history, social history, and previous encounter notes.  Time spent on visit including pre-visit chart review and post-visit care and charting was 20 minutes.   Coral Ceo, CMA, am acting as transcriptionist for [employer], DO.  I have reviewed the above documentation for accuracy and completeness, and I agree with the above. Marjory Sneddon, D.O.  The [place] was signed into law in 2016 which includes the topic of electronic health  records.  This provides immediate access to information in MyChart.  This includes consultation notes, operative notes, office notes, lab results and pathology reports.  If you have any questions about what you read please let us know at your next visit so we can discuss your concerns and take corrective action if need be.  We are right here with you.

## 2021-04-01 DIAGNOSIS — I8311 Varicose veins of right lower extremity with inflammation: Secondary | ICD-10-CM | POA: Diagnosis not present

## 2021-04-01 DIAGNOSIS — I8312 Varicose veins of left lower extremity with inflammation: Secondary | ICD-10-CM | POA: Diagnosis not present

## 2021-04-14 DIAGNOSIS — Z0000 Encounter for general adult medical examination without abnormal findings: Secondary | ICD-10-CM | POA: Diagnosis not present

## 2021-04-17 ENCOUNTER — Encounter (INDEPENDENT_AMBULATORY_CARE_PROVIDER_SITE_OTHER): Payer: Self-pay | Admitting: Bariatrics

## 2021-04-17 ENCOUNTER — Other Ambulatory Visit: Payer: Self-pay

## 2021-04-17 ENCOUNTER — Ambulatory Visit (INDEPENDENT_AMBULATORY_CARE_PROVIDER_SITE_OTHER): Payer: BC Managed Care – PPO | Admitting: Bariatrics

## 2021-04-17 VITALS — BP 140/82 | HR 60 | Temp 98.3°F | Ht 68.0 in | Wt 202.0 lb

## 2021-04-17 DIAGNOSIS — E559 Vitamin D deficiency, unspecified: Secondary | ICD-10-CM | POA: Diagnosis not present

## 2021-04-17 DIAGNOSIS — Z9189 Other specified personal risk factors, not elsewhere classified: Secondary | ICD-10-CM | POA: Diagnosis not present

## 2021-04-17 DIAGNOSIS — E669 Obesity, unspecified: Secondary | ICD-10-CM | POA: Diagnosis not present

## 2021-04-17 DIAGNOSIS — R632 Polyphagia: Secondary | ICD-10-CM

## 2021-04-17 DIAGNOSIS — R7303 Prediabetes: Secondary | ICD-10-CM

## 2021-04-17 DIAGNOSIS — Z6834 Body mass index (BMI) 34.0-34.9, adult: Secondary | ICD-10-CM

## 2021-04-17 MED ORDER — SAXENDA 18 MG/3ML ~~LOC~~ SOPN: 0.6000 mg | PEN_INJECTOR | Freq: Every day | SUBCUTANEOUS | 0 refills | Status: DC

## 2021-04-17 MED ORDER — INSULIN PEN NEEDLE 32G X 4 MM MISC: 0 refills | Status: DC

## 2021-04-17 NOTE — Telephone Encounter (Signed)
Please review

## 2021-04-21 ENCOUNTER — Encounter (INDEPENDENT_AMBULATORY_CARE_PROVIDER_SITE_OTHER): Payer: Self-pay

## 2021-04-21 ENCOUNTER — Telehealth (INDEPENDENT_AMBULATORY_CARE_PROVIDER_SITE_OTHER): Payer: Self-pay

## 2021-04-21 NOTE — Telephone Encounter (Signed)
[place] with Cover My Meds;  Ref #[id number]  Will a prior auth be submitted for Saxenda?

## 2021-04-21 NOTE — Telephone Encounter (Signed)
Patient notified  through [employer] is approved.

## 2021-04-22 NOTE — Progress Notes (Signed)
g    Chief Complaint:   OBESITY Jessica Drake is here to discuss her progress with her obesity treatment plan along with follow-up of her obesity related diagnoses. Jessica Drake is on following a lower carbohydrate, vegetable and lean protein rich diet plan and states she is following her eating plan approximately 75% of the time. Jessica Drake states she is doing cardio and weight training 20-60 minutes 4-5 times per week.  Today's visit was #: 76 Starting weight: 244 lbs Starting date: 04/22/2020 Today's weight: 202 lbs Today's date: 04/17/2021 Total lbs lost to date: 42 Total lbs lost since last in-office visit: 0  Interim History: Jessica Drake's weight remains the same. She says she thinks that she is stuck. Her appetite has not increased and she has no cravings.  Subjective:   1. Pre-diabetes Jessica Drake is not on medication.  2. Vitamin D insufficiency She is taking OTC Vit D.  3. Polyphagia She reports polyphagia at times.  4. At risk for hypoglycemia Jessica Drake is at increased risk for hypoglycemia due to changes in diet, diagnosis of diabetes, and/or insulin use.   Assessment/Plan:   1. Pre-diabetes Jessica Drake will continue to work on weight loss, exercise, and decreasing simple carbohydrates to help decrease the risk of diabetes. Increase healthy fats and protein.  2. Vitamin D insufficiency Low Vitamin D level contributes to fatigue and are associated with obesity, breast, and colon cancer. She agrees to continue to take OTC  Vitamin D and will follow-up for routine testing of Vitamin D, at least 2-3 times per year to avoid over-replacement.  3. Polyphagia Jessica Drake will consider Saxenda. Intensive lifestyle modifications are the first line treatment for this issue. We discussed several lifestyle modifications today and she will continue to work on diet, exercise and weight loss efforts. Orders and follow up as documented in patient record.  Counseling Polyphagia is excessive hunger. Causes can include:  low blood sugars, hypERthyroidism, PMS, lack of sleep, stress, insulin resistance, diabetes, certain medications, and diets that are deficient in protein and fiber.   4. At risk for hypoglycemia Jessica Drake was given approximately 15 minutes of counseling today regarding prevention of hypoglycemia. She was advised of symptoms of hypoglycemia. Jessica Drake was instructed to avoid skipping meals, eat regular protein rich meals and schedule low calorie snacks as needed.   Repetitive spaced learning was employed today to elicit superior memory formation and behavioral change  5. Obesity with current BMI 30.8  Jessica Drake is currently in the action stage of change. As such, her goal is to continue with weight loss efforts. She has agreed to following a lower carbohydrate, vegetable and lean protein rich diet plan.   Meal planning Intentional eating We discussed various medication options to help Jessica Drake with her weight loss efforts and we both agreed to start Saxenda 0.6 mg, as prescribed below. - Liraglutide -Weight Management (SAXENDA) 18 MG/3ML SOPN; Inject 0.6 mg into the skin daily. And toper over time  Dispense: 15 mL; Refill: 0 - Insulin Pen Needle 32G X 4 MM MISC; Use daily with Saxenda  Dispense: 100 each; Refill: 0  Exercise goals:  As is  Behavioral modification strategies: increasing lean protein intake, decreasing simple carbohydrates, increasing vegetables, increasing water intake, decreasing eating out, no skipping meals, meal planning and cooking strategies, keeping healthy foods in the home, and planning for success.  Jessica Drake has agreed to follow-up with our clinic in 2-3 weeks. She was informed of the importance of frequent follow-up visits to maximize her success with intensive lifestyle modifications for her  multiple health conditions.   Objective:   Blood pressure 140/82, pulse 60, temperature 98.3 F (36.8 C), height 5\' 8"  (1.727 m), weight 202 lb (91.6 kg), SpO2 98 %. Body mass index is  30.71 kg/m.  General: Cooperative, alert, well developed, in no acute distress. HEENT: Conjunctivae and lids unremarkable. Cardiovascular: Regular rhythm.  Lungs: Normal work of breathing. Neurologic: No focal deficits.   Lab Results  Component Value Date   CREATININE 0.91 02/04/2021   BUN 17 02/04/2021   NA 141 02/04/2021   K 4.5 02/04/2021   CL 105 02/04/2021   CO2 21 02/04/2021   Lab Results  Component Value Date   ALT 15 02/04/2021   AST 20 02/04/2021   ALKPHOS 56 02/04/2021   BILITOT 0.4 02/04/2021   Lab Results  Component Value Date   HGBA1C 5.4 02/04/2021   HGBA1C 5.4 10/10/2020   HGBA1C 5.7 (H) 04/22/2020   Lab Results  Component Value Date   INSULIN 6.8 02/04/2021   INSULIN 6.0 10/10/2020   INSULIN 10.7 04/22/2020   No results found for: TSH Lab Results  Component Value Date   CHOL 206 (H) 02/04/2021   HDL 76 02/04/2021   LDLCALC 118 (H) 02/04/2021   TRIG 65 02/04/2021   Lab Results  Component Value Date   VD25OH 71.5 02/04/2021   VD25OH 50.0 10/10/2020   No results found for: WBC, HGB, HCT, MCV, PLT No results found for: IRON, TIBC, FERRITIN  Attestation Statements:   Reviewed by clinician on day of visit: allergies, medications, problem list, medical history, surgical history, family history, social history, and previous encounter notes.  Coral Ceo, CMA, am acting as Location manager for [employer], DO.  I have reviewed the above documentation for accuracy and completeness, and I agree with the above. Jearld Lesch, DO

## 2021-04-23 ENCOUNTER — Encounter (INDEPENDENT_AMBULATORY_CARE_PROVIDER_SITE_OTHER): Payer: Self-pay | Admitting: Bariatrics

## 2021-05-01 ENCOUNTER — Other Ambulatory Visit: Payer: Self-pay

## 2021-05-01 ENCOUNTER — Encounter (INDEPENDENT_AMBULATORY_CARE_PROVIDER_SITE_OTHER): Payer: Self-pay | Admitting: Bariatrics

## 2021-05-01 ENCOUNTER — Ambulatory Visit (INDEPENDENT_AMBULATORY_CARE_PROVIDER_SITE_OTHER): Payer: BC Managed Care – PPO | Admitting: Bariatrics

## 2021-05-01 VITALS — BP 122/74 | HR 71 | Temp 97.7°F | Ht 68.0 in | Wt 195.0 lb

## 2021-05-01 DIAGNOSIS — E669 Obesity, unspecified: Secondary | ICD-10-CM

## 2021-05-01 DIAGNOSIS — Z6834 Body mass index (BMI) 34.0-34.9, adult: Secondary | ICD-10-CM

## 2021-05-01 DIAGNOSIS — I10 Essential (primary) hypertension: Secondary | ICD-10-CM

## 2021-05-01 DIAGNOSIS — E7800 Pure hypercholesterolemia, unspecified: Secondary | ICD-10-CM

## 2021-05-05 NOTE — Progress Notes (Signed)
Chief Complaint:   OBESITY Jessica Drake is here to discuss her progress with her obesity treatment plan along with follow-up of her obesity related diagnoses. Jessica Drake is on following a lower carbohydrate, vegetable and lean protein rich diet plan and states she is following her eating plan approximately 95% of the time. Jessica Drake states she is doing weights for 60 minutes 5 times per week and cardio for  20 minutes 3 times per week.  Today's visit was #: 13 Starting weight: 244 lbs Starting date: 04/22/2020 Today's weight: 195 lbs Today's date: 05/01/2021 Total lbs lost to date: 49 lbs Total lbs lost since last in-office visit: 7 lbs  Interim History: Jessica Drake is down an additional 7 lbs and doing well overall. She is taking [place].  Subjective:   1. Essential hypertension Jessica Drake is taking Cozaar.  2. Elevated cholesterol Jessica Drake is not on medications for elevated cholesterol.  Assessment/Plan:   1. Essential hypertension Jessica Drake will continue her medication. She is working on healthy weight loss and exercise to improve blood pressure control. We will watch for signs of hypotension as she continues her lifestyle modifications.   2. Elevated cholesterol Jessica Drake will increase Omega 3 and PUFAs and MUFAs.   3. Obesity with current BMI 29.8 Jessica Drake is currently in the action stage of change. As such, her goal is to continue with weight loss efforts. She has agreed to following a lower carbohydrate, vegetable and lean protein rich diet plan.   Jessica Drake will continue meal planning. She will continue intentional eating. She will continue Saxenda and will titrate as tolerated.   Exercise goals:  Jessica Drake has increased her exercise.  Behavioral modification strategies: increasing lean protein intake, decreasing simple carbohydrates, increasing vegetables, increasing water intake, decreasing eating out, no skipping meals, meal planning and cooking strategies, keeping healthy foods in the home, and  planning for success.  Jessica Drake has agreed to follow-up with our clinic in 2-3 weeks. She was informed of the importance of frequent follow-up visits to maximize her success with intensive lifestyle modifications for her multiple health conditions.   Objective:   Blood pressure 122/74, pulse 71, temperature 97.7 F (36.5 C), height '5\' 8"'$  (1.727 m), weight 195 lb (88.5 kg), SpO2 (!) 9 %. Body mass index is 29.65 kg/m.  General: Cooperative, alert, well developed, in no acute distress. HEENT: Conjunctivae and lids unremarkable. Cardiovascular: Regular rhythm.  Lungs: Normal work of breathing. Neurologic: No focal deficits.   Lab Results  Component Value Date   CREATININE 0.91 02/04/2021   BUN 17 02/04/2021   NA 141 02/04/2021   K 4.5 02/04/2021   CL 105 02/04/2021   CO2 21 02/04/2021   Lab Results  Component Value Date   ALT 15 02/04/2021   AST 20 02/04/2021   ALKPHOS 56 02/04/2021   BILITOT 0.4 02/04/2021   Lab Results  Component Value Date   HGBA1C 5.4 02/04/2021   HGBA1C 5.4 10/10/2020   HGBA1C 5.7 (H) 04/22/2020   Lab Results  Component Value Date   INSULIN 6.8 02/04/2021   INSULIN 6.0 10/10/2020   INSULIN 10.7 04/22/2020   No results found for: TSH Lab Results  Component Value Date   CHOL 206 (H) 02/04/2021   HDL 76 02/04/2021   LDLCALC 118 (H) 02/04/2021   TRIG 65 02/04/2021   Lab Results  Component Value Date   VD25OH 71.5 02/04/2021   VD25OH 50.0 10/10/2020   No results found for: WBC, HGB, HCT, MCV, PLT No results found for: IRON,  TIBC, FERRITIN  Attestation Statements:   Reviewed by clinician on day of visit: allergies, medications, problem list, medical history, surgical history, family history, social history, and previous encounter notes.  Time spent on visit including pre-visit chart review and post-visit care and charting was 20 minutes.   I, Lizbeth Bark, RMA, am acting as Location manager for [employer], DO.   I have reviewed the  above documentation for accuracy and completeness, and I agree with the above. Jearld Lesch, DO

## 2021-05-06 ENCOUNTER — Encounter (INDEPENDENT_AMBULATORY_CARE_PROVIDER_SITE_OTHER): Payer: Self-pay | Admitting: Bariatrics

## 2021-05-19 ENCOUNTER — Other Ambulatory Visit (INDEPENDENT_AMBULATORY_CARE_PROVIDER_SITE_OTHER): Payer: Self-pay | Admitting: Bariatrics

## 2021-05-19 DIAGNOSIS — E669 Obesity, unspecified: Secondary | ICD-10-CM

## 2021-05-19 NOTE — Telephone Encounter (Signed)
Dr.Brown 

## 2021-05-22 ENCOUNTER — Ambulatory Visit (INDEPENDENT_AMBULATORY_CARE_PROVIDER_SITE_OTHER): Payer: BC Managed Care – PPO | Admitting: Bariatrics

## 2021-05-22 ENCOUNTER — Other Ambulatory Visit: Payer: Self-pay

## 2021-05-22 ENCOUNTER — Encounter (INDEPENDENT_AMBULATORY_CARE_PROVIDER_SITE_OTHER): Payer: Self-pay | Admitting: Bariatrics

## 2021-05-22 VITALS — BP 132/83 | HR 66 | Temp 97.6°F | Ht 68.0 in | Wt 194.0 lb

## 2021-05-22 DIAGNOSIS — Z9189 Other specified personal risk factors, not elsewhere classified: Secondary | ICD-10-CM

## 2021-05-22 DIAGNOSIS — E7800 Pure hypercholesterolemia, unspecified: Secondary | ICD-10-CM

## 2021-05-22 DIAGNOSIS — E669 Obesity, unspecified: Secondary | ICD-10-CM | POA: Diagnosis not present

## 2021-05-22 DIAGNOSIS — I10 Essential (primary) hypertension: Secondary | ICD-10-CM

## 2021-05-22 DIAGNOSIS — Z6834 Body mass index (BMI) 34.0-34.9, adult: Secondary | ICD-10-CM

## 2021-05-22 MED ORDER — SAXENDA 18 MG/3ML ~~LOC~~ SOPN: 0.9000 mg | PEN_INJECTOR | Freq: Every day | SUBCUTANEOUS | 0 refills | Status: DC

## 2021-05-23 NOTE — Progress Notes (Signed)
Chief Complaint:   OBESITY Jessica Drake is here to discuss her progress with her obesity treatment plan along with follow-up of her obesity related diagnoses. Jessica Drake is on following a lower carbohydrate, vegetable and lean protein rich diet plan and states she is following her eating plan approximately 90% of the time. Jessica Drake states she is doing cardio/weights 90 minutes 5 times per week.  Today's visit was #: 20 Starting weight: 244 lbs Starting date: 04/22/2020 Today's weight: 194 lbs Today's date: 05/22/2021 Total lbs lost to date: 50 lbs Total lbs lost since last in-office visit: 1 lb  Interim History: Jessica Drake is down 1 additional lb since her last visit. She is taking Saxenda and has minimal nausea.  Subjective:   1. Essential hypertension Review: taking medications as instructed, no medication side effects noted, no chest pain on exertion, no dyspnea on exertion, no swelling of ankles. Jessica Drake is taking Cozaar and blood pressure is reasonably controlled.  BP Readings from Last 3 Encounters:  05/22/21 132/83  05/01/21 122/74  04/17/21 140/82    2. Elevated cholesterol Jessica Drake is not on medications for elevated cholesterol.  3. At risk for heart disease Jessica Drake is at a higher than average risk for cardiovascular disease due to obesity and elevated cholesterol.  Assessment/Plan:   1. Essential hypertension Jessica Drake is working on healthy weight loss and exercise to improve blood pressure control. We will watch for signs of hypotension as she continues her lifestyle modifications. She will continue taking her medication.  2. Elevated cholesterol Cardiovascular risk and specific lipid/LDL goals reviewed. We discussed several lifestyle modifications today. We will check labs today. Jessica Drake will continue to work on diet, exercise and weight loss efforts. Orders and follow up as documented in patient record.    Counseling Intensive lifestyle modifications are the first line treatment  for this issue. Dietary changes: Increase soluble fiber. Decrease simple carbohydrates. Exercise changes: Moderate to vigorous-intensity aerobic activity 150 minutes per week if tolerated. Lipid-lowering medications: see documented in medical record.  3. At risk for heart disease Jessica Drake was given approximately 15 minutes of coronary artery disease prevention counseling today. She is 60 y.o. female and has risk factors for heart disease including obesity. We discussed intensive lifestyle modifications today with an emphasis on specific weight loss instructions and strategies.    Repetitive spaced learning was employed today to elicit superior memory formation and behavioral change.   4. Obesity with current BMI 30.8 Jessica Drake is currently in the action stage of change. As such, her goal is to continue with weight loss efforts. She has agreed to following a lower carbohydrate, vegetable and lean protein rich diet plan.   - Liraglutide -Weight Management (SAXENDA) 18 MG/3ML SOPN; Inject 0.9 mg into the skin daily. And taper over time  Dispense: 15 mL; Refill: 0 Increase water intake Will weigh her protein  Exercise goals: As is  Behavioral modification strategies: increasing lean protein intake, decreasing simple carbohydrates, increasing vegetables, increasing water intake, decreasing eating out, no skipping meals, meal planning and cooking strategies, keeping healthy foods in the home, and planning for success.  Jessica Drake has agreed to follow-up with our clinic in 4 weeks. She was informed of the importance of frequent follow-up visits to maximize her success with intensive lifestyle modifications for her multiple health conditions.   Objective:   Blood pressure 132/83, pulse 66, temperature 97.6 F (36.4 C), height '5\' 8"'$  (1.727 m), weight 194 lb (88 kg), SpO2 98 %. Body mass index is 29.5 kg/m.  General: Cooperative, alert, well developed, in no acute distress. HEENT: Conjunctivae and lids  unremarkable. Cardiovascular: Regular rhythm.  Lungs: Normal work of breathing. Neurologic: No focal deficits.   Lab Results  Component Value Date   CREATININE 0.91 02/04/2021   BUN 17 02/04/2021   NA 141 02/04/2021   K 4.5 02/04/2021   CL 105 02/04/2021   CO2 21 02/04/2021   Lab Results  Component Value Date   ALT 15 02/04/2021   AST 20 02/04/2021   ALKPHOS 56 02/04/2021   BILITOT 0.4 02/04/2021   Lab Results  Component Value Date   HGBA1C 5.4 02/04/2021   HGBA1C 5.4 10/10/2020   HGBA1C 5.7 (H) 04/22/2020   Lab Results  Component Value Date   INSULIN 6.8 02/04/2021   INSULIN 6.0 10/10/2020   INSULIN 10.7 04/22/2020   No results found for: TSH Lab Results  Component Value Date   CHOL 206 (H) 02/04/2021   HDL 76 02/04/2021   LDLCALC 118 (H) 02/04/2021   TRIG 65 02/04/2021   Lab Results  Component Value Date   VD25OH 71.5 02/04/2021   VD25OH 50.0 10/10/2020   No results found for: WBC, HGB, HCT, MCV, PLT No results found for: IRON, TIBC, FERRITIN  Attestation Statements:   Reviewed by clinician on day of visit: allergies, medications, problem list, medical history, surgical history, family history, social history, and previous encounter notes.  ILennette Bihari, CMA, am acting as transcriptionist for Dr. Jearld Lesch, DO.  I have reviewed the above documentation for accuracy and completeness, and I agree with the above. Jearld Lesch, DO

## 2021-05-27 ENCOUNTER — Encounter (INDEPENDENT_AMBULATORY_CARE_PROVIDER_SITE_OTHER): Payer: Self-pay | Admitting: Bariatrics

## 2021-06-16 ENCOUNTER — Ambulatory Visit (INDEPENDENT_AMBULATORY_CARE_PROVIDER_SITE_OTHER): Payer: BC Managed Care – PPO | Admitting: Bariatrics

## 2021-06-16 ENCOUNTER — Encounter (INDEPENDENT_AMBULATORY_CARE_PROVIDER_SITE_OTHER): Payer: Self-pay | Admitting: Bariatrics

## 2021-06-16 ENCOUNTER — Other Ambulatory Visit: Payer: Self-pay

## 2021-06-16 VITALS — BP 131/81 | HR 74 | Temp 97.9°F | Ht 68.0 in | Wt 195.0 lb

## 2021-06-16 DIAGNOSIS — E669 Obesity, unspecified: Secondary | ICD-10-CM

## 2021-06-16 DIAGNOSIS — Z6834 Body mass index (BMI) 34.0-34.9, adult: Secondary | ICD-10-CM

## 2021-06-16 DIAGNOSIS — R632 Polyphagia: Secondary | ICD-10-CM

## 2021-06-16 DIAGNOSIS — R7303 Prediabetes: Secondary | ICD-10-CM

## 2021-06-16 DIAGNOSIS — Z9189 Other specified personal risk factors, not elsewhere classified: Secondary | ICD-10-CM | POA: Diagnosis not present

## 2021-06-16 MED ORDER — SAXENDA 18 MG/3ML ~~LOC~~ SOPN: 1.8000 mg | PEN_INJECTOR | Freq: Every day | SUBCUTANEOUS | 0 refills | Status: DC

## 2021-06-16 NOTE — Progress Notes (Signed)
Chief Complaint:   OBESITY Jessica Drake is here to discuss her progress with her obesity treatment plan along with follow-up of her obesity related diagnoses. Jessica Drake is on following a lower carbohydrate, vegetable and lean protein rich diet plan and states she is following her eating plan approximately 60% of the time. Jessica Drake states she is doing cardio and weights for 60 minutes 2 times per week.  Today's visit was #: 21 Starting weight: 244 lbs Starting date: 04/22/2020 Today's weight: 195 lbs Today's date: 06/16/2021 Total lbs lost to date: 49 lbs Total lbs lost since last in-office visit: 0  Interim History: Jessica Drake is up 1 lb since her last visit but has done well overall. She has not been doing enough water.  Subjective:   1. Pre-diabetes Jessica Drake is currently taking [place].  2. Polyphagia Jessica Drake is currently taking [place].  3. At risk of diabetes mellitus Jessica Drake is at risk of diabetes mellitus due to Pre-diabetes.   Assessment/Plan:   1. Pre-diabetes Jessica Drake will continue to work on weight loss, and decreasing simple carbohydrates to help decrease the risk of diabetes. She will continue to work on exercise and activities.  2. Polyphagia Intensive lifestyle modifications are the first line treatment for this issue. We discussed several lifestyle modifications today and she will continue to work on diet, exercise and weight loss efforts. Jessica Drake will decrease carbohydrates and she will increase healthy fats and protein. Orders and follow up as documented in patient record.  Counseling Polyphagia is excessive hunger. Causes can include: low blood sugars, hypERthyroidism, PMS, lack of sleep, stress, insulin resistance, diabetes, certain medications, and diets that are deficient in protein and fiber.    3. At risk of diabetes mellitus Jessica Drake was given approximately 15 minutes of diabetes education and counseling today. We discussed intensive lifestyle modifications today with an  emphasis on weight loss as well as increasing exercise and decreasing simple carbohydrates in her diet. We also reviewed medication options with an emphasis on risk versus benefit of those discussed.   Repetitive spaced learning was employed today to elicit superior memory formation and behavioral change.   4. Obesity with current BMI 29.8 Jessica Drake is currently in the action stage of change. As such, her goal is to continue with weight loss efforts. She has agreed to following a lower carbohydrate, vegetable and lean protein rich diet plan.   Jessica Drake will continue meal planning. She will continue intentional eating. She will adhere closely to the plan. We will refill Saxenda 18 mg with no refills.  - Liraglutide -Weight Management (SAXENDA) 18 MG/3ML SOPN; Inject 1.8 mg into the skin daily. And taper over time  Dispense: 15 mL; Refill: 0  Exercise goals:  As is.  Behavioral modification strategies: increasing lean protein intake, decreasing simple carbohydrates, increasing vegetables, increasing water intake, decreasing eating out, no skipping meals, meal planning and cooking strategies, keeping healthy foods in the home, and planning for success.  Jessica Drake has agreed to follow-up with our clinic in 4 weeks (fasting). She was informed of the importance of frequent follow-up visits to maximize her success with intensive lifestyle modifications for her multiple health conditions.   Objective:   Blood pressure 131/81, pulse 74, temperature 97.9 F (36.6 C), height '5\' 8"'$  (1.727 m), weight 195 lb (88.5 kg), SpO2 98 %. Body mass index is 29.65 kg/m.  General: Cooperative, alert, well developed, in no acute distress. HEENT: Conjunctivae and lids unremarkable. Cardiovascular: Regular rhythm.  Lungs: Normal work of breathing. Neurologic: No focal  deficits.   Lab Results  Component Value Date   CREATININE 0.91 02/04/2021   BUN 17 02/04/2021   NA 141 02/04/2021   K 4.5 02/04/2021   CL 105  02/04/2021   CO2 21 02/04/2021   Lab Results  Component Value Date   ALT 15 02/04/2021   AST 20 02/04/2021   ALKPHOS 56 02/04/2021   BILITOT 0.4 02/04/2021   Lab Results  Component Value Date   HGBA1C 5.4 02/04/2021   HGBA1C 5.4 10/10/2020   HGBA1C 5.7 (H) 04/22/2020   Lab Results  Component Value Date   INSULIN 6.8 02/04/2021   INSULIN 6.0 10/10/2020   INSULIN 10.7 04/22/2020   No results found for: TSH Lab Results  Component Value Date   CHOL 206 (H) 02/04/2021   HDL 76 02/04/2021   LDLCALC 118 (H) 02/04/2021   TRIG 65 02/04/2021   Lab Results  Component Value Date   VD25OH 71.5 02/04/2021   VD25OH 50.0 10/10/2020   No results found for: WBC, HGB, HCT, MCV, PLT No results found for: IRON, TIBC, FERRITIN  Attestation Statements:   Reviewed by clinician on day of visit: allergies, medications, problem list, medical history, surgical history, family history, social history, and previous encounter notes.  I, Lizbeth Bark, RMA, am acting as Location manager for [employer], DO.   I have reviewed the above documentation for accuracy and completeness, and I agree with the above. Jearld Lesch, DO

## 2021-07-14 ENCOUNTER — Encounter (INDEPENDENT_AMBULATORY_CARE_PROVIDER_SITE_OTHER): Payer: Self-pay | Admitting: Bariatrics

## 2021-07-14 ENCOUNTER — Ambulatory Visit (INDEPENDENT_AMBULATORY_CARE_PROVIDER_SITE_OTHER): Payer: BC Managed Care – PPO | Admitting: Bariatrics

## 2021-07-14 ENCOUNTER — Other Ambulatory Visit: Payer: Self-pay

## 2021-07-14 VITALS — BP 135/74 | HR 57 | Temp 97.8°F | Ht 68.0 in | Wt 199.0 lb

## 2021-07-14 DIAGNOSIS — I10 Essential (primary) hypertension: Secondary | ICD-10-CM

## 2021-07-14 DIAGNOSIS — E559 Vitamin D deficiency, unspecified: Secondary | ICD-10-CM

## 2021-07-14 DIAGNOSIS — Z6837 Body mass index (BMI) 37.0-37.9, adult: Secondary | ICD-10-CM

## 2021-07-14 DIAGNOSIS — R632 Polyphagia: Secondary | ICD-10-CM | POA: Diagnosis not present

## 2021-07-14 DIAGNOSIS — Z23 Encounter for immunization: Secondary | ICD-10-CM | POA: Diagnosis not present

## 2021-07-14 MED ORDER — INSULIN PEN NEEDLE 32G X 4 MM MISC: 0 refills | Status: DC

## 2021-07-14 MED ORDER — SAXENDA 18 MG/3ML ~~LOC~~ SOPN: 1.8000 mg | PEN_INJECTOR | Freq: Every day | SUBCUTANEOUS | 0 refills | Status: DC

## 2021-07-14 NOTE — Progress Notes (Signed)
Chief Complaint:   OBESITY Jessica Drake is here to discuss her progress with her obesity treatment plan along with follow-up of her obesity related diagnoses. Jessica Drake is on following a lower carbohydrate, vegetable and lean protein rich diet plan and states she is following her eating plan approximately 65% of the time. Jessica Drake states she is doing weight training for 60 minutes 3 times per week.  Today's visit was #: 22 Starting weight: 244 lbs Starting date: 04/22/2020 Today's weight: 199 lbs Today's date: 07/14/2021 Total lbs lost to date: 45 lbs Total lbs lost since last in-office visit: 0  Interim History: Jessica Drake is up 4 lbs since her last visit. She is up 4 lbs of water per the bioimpedance scale.  Subjective:   1. Essential hypertension Jessica Drake is taking Cozaar currently.  2. Polyphagia Jessica Drake is currently taking [place].  3. Vitamin D deficiency Jessica Drake is taking her medications as directed.  Assessment/Plan:   1. Essential hypertension Jessica Drake will continue her medications. We will check labs today. She is working on healthy weight loss and exercise to improve blood pressure control. We will watch for signs of hypotension as she continues her lifestyle modifications.  - Comprehensive metabolic panel - Lipid Panel With LDL/HDL Ratio  2. Polyphagia Intensive lifestyle modifications are the first line treatment for this issue. We discussed several lifestyle modifications today and she will continue to work on diet, exercise and weight loss efforts. Jessica Drake will continue taking Saxenda. Orders and follow up as documented in patient record.  Counseling Polyphagia is excessive hunger. Causes can include: low blood sugars, hypERthyroidism, PMS, lack of sleep, stress, insulin resistance, diabetes, certain medications, and diets that are deficient in protein and fiber.    3. Vitamin D deficiency Low Vitamin D level contributes to fatigue and are associated with obesity, breast,  and colon cancer. Jessica Drake agrees to continue to take prescription Vitamin D 500 MG-UNIT chewable and she will follow-up for routine testing of Vitamin D, at least 2-3 times per year to avoid over-replacement. We will check labs today.  - VITAMIN D 25 Hydroxy (Vit-D Deficiency, Fractures)  4. Obesity with current BMI of 30.3 Jessica Drake is currently in the action stage of change. As such, her goal is to continue with weight loss efforts. She has agreed to Journaling 1100 calories and 80 grams of protein, the Category 1 Plan, the [place], and following a lower carbohydrate, vegetable and lean protein rich diet plan.   Jessica Drake will adhere more closely to the plan. She will continue meal planning. We will refill Saxenda as directed. We will refill Insulin Pen Needles 32G X 4 100 each with no refills. She will increase her water intake.  - Liraglutide -Weight Management (SAXENDA) 18 MG/3ML SOPN; Inject 1.8 mg into the skin daily. And taper over time  Dispense: 15 mL; Refill: 0  - Insulin Pen Needle 32G X 4 MM MISC; Use daily with Saxenda  Dispense: 100 each; Refill: 0  Exercise goals:  Jessica Drake will continue weight as above. She will add in cardio.  Behavioral modification strategies: increasing lean protein intake, decreasing simple carbohydrates, increasing vegetables, increasing water intake, decreasing eating out, no skipping meals, meal planning and cooking strategies, keeping healthy foods in the home, and planning for success.  Jessica Drake has agreed to follow-up with our clinic in 4 weeks. She was informed of the importance of frequent follow-up visits to maximize her success with intensive lifestyle modifications for her multiple health conditions.   Jessica Drake was informed  we would discuss her lab results at her next visit unless there is a critical issue that needs to be addressed sooner. Jessica Drake agreed to keep her next visit at the agreed upon time to discuss these results.  Objective:   Blood  pressure 135/74, pulse (!) 57, temperature 97.8 F (36.6 C), height 5\' 8"  (1.727 m), weight 199 lb (90.3 kg), SpO2 100 %. Body mass index is 30.26 kg/m.  General: Cooperative, alert, well developed, in no acute distress. HEENT: Conjunctivae and lids unremarkable. Cardiovascular: Regular rhythm.  Lungs: Normal work of breathing. Neurologic: No focal deficits.   Lab Results  Component Value Date   CREATININE 0.91 02/04/2021   BUN 17 02/04/2021   NA 141 02/04/2021   K 4.5 02/04/2021   CL 105 02/04/2021   CO2 21 02/04/2021   Lab Results  Component Value Date   ALT 15 02/04/2021   AST 20 02/04/2021   ALKPHOS 56 02/04/2021   BILITOT 0.4 02/04/2021   Lab Results  Component Value Date   HGBA1C 5.4 02/04/2021   HGBA1C 5.4 10/10/2020   HGBA1C 5.7 (H) 04/22/2020   Lab Results  Component Value Date   INSULIN 6.8 02/04/2021   INSULIN 6.0 10/10/2020   INSULIN 10.7 04/22/2020   No results found for: TSH Lab Results  Component Value Date   CHOL 206 (H) 02/04/2021   HDL 76 02/04/2021   LDLCALC 118 (H) 02/04/2021   TRIG 65 02/04/2021   Lab Results  Component Value Date   VD25OH 71.5 02/04/2021   VD25OH 50.0 10/10/2020   No results found for: WBC, HGB, HCT, MCV, PLT No results found for: IRON, TIBC, FERRITIN  Attestation Statements:   Reviewed by clinician on day of visit: allergies, medications, problem list, medical history, surgical history, family history, social history, and previous encounter notes.  I, Lizbeth Bark, RMA, am acting as Location manager for [employer], DO.   I have reviewed the above documentation for accuracy and completeness, and I agree with the above. Jearld Lesch, DO

## 2021-07-15 LAB — COMPREHENSIVE METABOLIC PANEL
ALT: 14 IU/L (ref 0–32)
AST: 16 IU/L (ref 0–40)
Albumin/Globulin Ratio: 3.2 — ABNORMAL HIGH (ref 1.2–2.2)
Albumin: 4.5 g/dL (ref 3.8–4.8)
Alkaline Phosphatase: 54 IU/L (ref 44–121)
BUN/Creatinine Ratio: 22 (ref 12–28)
BUN: 20 mg/dL (ref 8–27)
Bilirubin Total: 0.5 mg/dL (ref 0.0–1.2)
CO2: 19 mmol/L — ABNORMAL LOW (ref 20–29)
Calcium: 9.2 mg/dL (ref 8.7–10.3)
Chloride: 107 mmol/L — ABNORMAL HIGH (ref 96–106)
Creatinine, Ser: 0.91 mg/dL (ref 0.57–1.00)
Globulin, Total: 1.4 g/dL — ABNORMAL LOW (ref 1.5–4.5)
Glucose: 96 mg/dL (ref 70–99)
Potassium: 4.7 mmol/L (ref 3.5–5.2)
Sodium: 142 mmol/L (ref 134–144)
Total Protein: 5.9 g/dL — ABNORMAL LOW (ref 6.0–8.5)
eGFR: 72 mL/min/{1.73_m2} (ref >59)

## 2021-07-15 LAB — LIPID PANEL WITH LDL/HDL RATIO
Cholesterol, Total: 201 mg/dL — ABNORMAL HIGH (ref 100–199)
HDL: 85 mg/dL (ref >39)
LDL Chol Calc (NIH): 108 mg/dL — ABNORMAL HIGH (ref 0–99)
LDL/HDL Ratio: 1.3 ratio (ref 0.0–3.2)
Triglycerides: 41 mg/dL (ref 0–149)
VLDL Cholesterol Cal: 8 mg/dL (ref 5–40)

## 2021-07-15 LAB — VITAMIN D 25 HYDROXY (VIT D DEFICIENCY, FRACTURES): Vit D, 25-Hydroxy: 81.9 ng/mL (ref 30.0–100.0)

## 2021-07-16 ENCOUNTER — Encounter (INDEPENDENT_AMBULATORY_CARE_PROVIDER_SITE_OTHER): Payer: Self-pay | Admitting: Bariatrics

## 2021-08-11 ENCOUNTER — Ambulatory Visit (INDEPENDENT_AMBULATORY_CARE_PROVIDER_SITE_OTHER): Payer: BC Managed Care – PPO | Admitting: Bariatrics

## 2021-08-11 ENCOUNTER — Other Ambulatory Visit: Payer: Self-pay

## 2021-08-11 ENCOUNTER — Encounter (INDEPENDENT_AMBULATORY_CARE_PROVIDER_SITE_OTHER): Payer: Self-pay | Admitting: Bariatrics

## 2021-08-11 VITALS — BP 142/86 | HR 62 | Temp 98.1°F | Ht 68.0 in | Wt 200.0 lb

## 2021-08-11 DIAGNOSIS — R7303 Prediabetes: Secondary | ICD-10-CM | POA: Diagnosis not present

## 2021-08-11 DIAGNOSIS — Z6837 Body mass index (BMI) 37.0-37.9, adult: Secondary | ICD-10-CM | POA: Diagnosis not present

## 2021-08-11 DIAGNOSIS — I10 Essential (primary) hypertension: Secondary | ICD-10-CM

## 2021-08-11 NOTE — Progress Notes (Signed)
Chief Complaint:   OBESITY Jessica Drake is here to discuss her progress with her obesity treatment plan along with follow-up of her obesity related diagnoses. Jessica Drake is on following a lower carbohydrate, vegetable and lean protein rich diet plan and states she is following her eating plan approximately 0% of the time. Jessica Drake states she is doing 0 minutes 0 times per week.  Today's visit was #: 23 Starting weight: 244 lbs Starting date: 04/22/2020 Today's weight: 200 lbs Today's date: 08/11/2021 Total lbs lost to date: 44 lbs Total lbs lost since last in-office visit: 0  Interim History: Jessica Drake is up 1 lb.  Subjective:   1. Essential hypertension Jessica Drake's blood pressure is slightly elevated today at 142/86. It is usually well controlled.   2. Pre-diabetes Jessica Drake is taking [place]. She will decrease carbohydrates.   Assessment/Plan:   1. Essential hypertension Jessica Drake will continue her medications. She is working on healthy weight loss and exercise to improve blood pressure control. We will watch for signs of hypotension as she continues her lifestyle modifications.  2. Pre-diabetes Jessica Drake will continue to cut carbohydrates. She will increase healthy fats and protein. Good blood sugar control is important to decrease the likelihood of diabetic complications such as nephropathy, neuropathy, limb loss, blindness, coronary artery disease, and death. Intensive lifestyle modification including diet, exercise and weight loss are the first line of treatment for diabetes.   3. Obesity with current BMI of 30.4 Jessica Drake is currently in the action stage of change. As such, her goal is to continue with weight loss efforts. She has agreed to following a lower carbohydrate, vegetable and lean protein rich diet plan. The New Atkins.   Jessica Drake will continue meal planning. She will adhere closely to the plan at 80-90%.   Exercise goals:  Jessica Drake has hurt her back and she is exercising less. She will do  some stretching exercise and use the elliptical.   Behavioral modification strategies: increasing lean protein intake, decreasing simple carbohydrates, increasing vegetables, increasing water intake, decreasing eating out, no skipping meals, meal planning and cooking strategies, keeping healthy foods in the home, and planning for success.  Jessica Drake has agreed to follow-up with our clinic in 3 weeks. She was informed of the importance of frequent follow-up visits to maximize her success with intensive lifestyle modifications for her multiple health conditions.   Objective:   Blood pressure (!) 142/86, pulse 62, temperature 98.1 F (36.7 C), height 5\' 8"  (1.727 m), weight 200 lb (90.7 kg), SpO2 100 %. Body mass index is 30.41 kg/m.  General: Cooperative, alert, well developed, in no acute distress. HEENT: Conjunctivae and lids unremarkable. Cardiovascular: Regular rhythm.  Lungs: Normal work of breathing. Neurologic: No focal deficits.   Lab Results  Component Value Date   CREATININE 0.91 07/14/2021   BUN 20 07/14/2021   NA 142 07/14/2021   K 4.7 07/14/2021   CL 107 (H) 07/14/2021   CO2 19 (L) 07/14/2021   Lab Results  Component Value Date   ALT 14 07/14/2021   AST 16 07/14/2021   ALKPHOS 54 07/14/2021   BILITOT 0.5 07/14/2021   Lab Results  Component Value Date   HGBA1C 5.4 02/04/2021   HGBA1C 5.4 10/10/2020   HGBA1C 5.7 (H) 04/22/2020   Lab Results  Component Value Date   INSULIN 6.8 02/04/2021   INSULIN 6.0 10/10/2020   INSULIN 10.7 04/22/2020   No results found for: TSH Lab Results  Component Value Date   CHOL 201 (H) 07/14/2021  HDL 85 07/14/2021   LDLCALC 108 (H) 07/14/2021   TRIG 41 07/14/2021   Lab Results  Component Value Date   VD25OH 81.9 07/14/2021   VD25OH 71.5 02/04/2021   VD25OH 50.0 10/10/2020   No results found for: WBC, HGB, HCT, MCV, PLT No results found for: IRON, TIBC, FERRITIN  Attestation Statements:   Reviewed by clinician on day  of visit: allergies, medications, problem list, medical history, surgical history, family history, social history, and previous encounter notes.  I, Lizbeth Bark, RMA, am acting as Location manager for [employer], DO.   I have reviewed the above documentation for accuracy and completeness, and I agree with the above. Jearld Lesch, DO

## 2021-08-12 DIAGNOSIS — E049 Nontoxic goiter, unspecified: Secondary | ICD-10-CM | POA: Diagnosis not present

## 2021-08-12 DIAGNOSIS — E042 Nontoxic multinodular goiter: Secondary | ICD-10-CM | POA: Diagnosis not present

## 2021-08-18 ENCOUNTER — Other Ambulatory Visit: Payer: Self-pay | Admitting: Surgery

## 2021-08-18 DIAGNOSIS — E049 Nontoxic goiter, unspecified: Secondary | ICD-10-CM

## 2021-08-18 DIAGNOSIS — E042 Nontoxic multinodular goiter: Secondary | ICD-10-CM

## 2021-08-21 DIAGNOSIS — H5213 Myopia, bilateral: Secondary | ICD-10-CM | POA: Diagnosis not present

## 2021-08-21 DIAGNOSIS — H40019 Open angle with borderline findings, low risk, unspecified eye: Secondary | ICD-10-CM | POA: Diagnosis not present

## 2021-08-22 DIAGNOSIS — I8312 Varicose veins of left lower extremity with inflammation: Secondary | ICD-10-CM | POA: Diagnosis not present

## 2021-08-27 DIAGNOSIS — I8312 Varicose veins of left lower extremity with inflammation: Secondary | ICD-10-CM | POA: Diagnosis not present

## 2021-09-01 ENCOUNTER — Other Ambulatory Visit: Payer: Self-pay

## 2021-09-01 ENCOUNTER — Other Ambulatory Visit (INDEPENDENT_AMBULATORY_CARE_PROVIDER_SITE_OTHER): Payer: Self-pay | Admitting: Bariatrics

## 2021-09-01 ENCOUNTER — Encounter (INDEPENDENT_AMBULATORY_CARE_PROVIDER_SITE_OTHER): Payer: Self-pay | Admitting: Bariatrics

## 2021-09-01 ENCOUNTER — Ambulatory Visit
Admission: RE | Admit: 2021-09-01 | Discharge: 2021-09-01 | Disposition: A | Discharge status: Home / Self Care | Payer: BC Managed Care – PPO | Source: Ambulatory Visit | Attending: Surgery | Admitting: Surgery

## 2021-09-01 ENCOUNTER — Ambulatory Visit (INDEPENDENT_AMBULATORY_CARE_PROVIDER_SITE_OTHER): Payer: BC Managed Care – PPO | Admitting: Bariatrics

## 2021-09-01 VITALS — BP 142/81 | HR 73 | Temp 97.9°F | Ht 68.0 in | Wt 199.0 lb

## 2021-09-01 DIAGNOSIS — E559 Vitamin D deficiency, unspecified: Secondary | ICD-10-CM

## 2021-09-01 DIAGNOSIS — Z6837 Body mass index (BMI) 37.0-37.9, adult: Secondary | ICD-10-CM | POA: Diagnosis not present

## 2021-09-01 DIAGNOSIS — E049 Nontoxic goiter, unspecified: Secondary | ICD-10-CM

## 2021-09-01 DIAGNOSIS — R7303 Prediabetes: Secondary | ICD-10-CM | POA: Diagnosis not present

## 2021-09-01 DIAGNOSIS — E042 Nontoxic multinodular goiter: Secondary | ICD-10-CM

## 2021-09-01 DIAGNOSIS — E041 Nontoxic single thyroid nodule: Secondary | ICD-10-CM | POA: Diagnosis not present

## 2021-09-01 MED ORDER — SAXENDA 18 MG/3ML ~~LOC~~ SOPN: 1.8000 mg | PEN_INJECTOR | Freq: Every day | SUBCUTANEOUS | 0 refills | Status: DC

## 2021-09-01 NOTE — Telephone Encounter (Signed)
Pt last seen by Dr. Brown.  

## 2021-09-01 NOTE — Progress Notes (Signed)
Chief Complaint:   OBESITY Jessica Drake is here to discuss her progress with her obesity treatment plan along with follow-up of her obesity related diagnoses. Jessica Drake is on following a lower carbohydrate, vegetable and lean protein rich diet plan and states she is following her eating plan approximately 75% of the time. Jessica Drake states she is walking for 20 minutes 3 times per week.  Today's visit was #: 24 Starting weight: 244 lbs Starting date: 04/22/2020 Today's weight: 199 lbs Today's date: 09/01/2021 Total lbs lost to date: 45 lbs Total lbs lost since last in-office visit: 1 lb  Interim History: Jessica Drake is down 1 lb since last week. She is taking Saxenda 1.8 mg into skin daily. Her appetite is not excessive and she describes it as normal.   Subjective:   1. Pre-diabetes Jessica Drake is currently taking [place].  2. Vitamin D deficiency Jessica Drake is taking OTC Vitamin D currently.  Assessment/Plan:   1. Pre-diabetes Jessica Drake will keep sugar well. She will continue to work on weight loss, exercise, and decreasing simple carbohydrates to help decrease the risk of diabetes.   2. Vitamin D deficiency Low Vitamin D level contributes to fatigue and are associated with obesity, breast, and colon cancer. Jessica Drake agrees to continue to take prescription Vitamin D 50,000 IU every week and she will follow-up for routine testing of Vitamin D, at least 2-3 times per year to avoid over-replacement.   3. Obesity with current BMI of 30.3 Jessica Drake is currently in the action stage of change. As such, her goal is to continue with weight loss efforts. She has agreed to following a lower carbohydrate, vegetable and lean protein rich diet plan.   Jessica Drake will continue meal planning and she will continue intentional eating.  We will refill Saxenda for 1 month with no refills.   - Liraglutide -Weight Management (SAXENDA) 18 MG/3ML SOPN; Inject 1.8 mg into the skin daily. And taper over time  Dispense: 15 mL; Refill:  0  Exercise goals:  As is.  Behavioral modification strategies: increasing lean protein intake, decreasing simple carbohydrates, increasing vegetables, increasing water intake, decreasing eating out, no skipping meals, meal planning and cooking strategies, keeping healthy foods in the home, and planning for success.  Jessica Drake has agreed to follow-up with our clinic in 4-6 weeks. She was informed of the importance of frequent follow-up visits to maximize her success with intensive lifestyle modifications for her multiple health conditions.   Objective:   Blood pressure (!) 142/81, pulse 73, temperature 97.9 F (36.6 C), height 5\' 8"  (1.727 m), weight 199 lb (90.3 kg), SpO2 100 %. Body mass index is 30.26 kg/m.  General: Cooperative, alert, well developed, in no acute distress. HEENT: Conjunctivae and lids unremarkable. Cardiovascular: Regular rhythm.  Lungs: Normal work of breathing. Neurologic: No focal deficits.   Lab Results  Component Value Date   CREATININE 0.91 07/14/2021   BUN 20 07/14/2021   NA 142 07/14/2021   K 4.7 07/14/2021   CL 107 (H) 07/14/2021   CO2 19 (L) 07/14/2021   Lab Results  Component Value Date   ALT 14 07/14/2021   AST 16 07/14/2021   ALKPHOS 54 07/14/2021   BILITOT 0.5 07/14/2021   Lab Results  Component Value Date   HGBA1C 5.4 02/04/2021   HGBA1C 5.4 10/10/2020   HGBA1C 5.7 (H) 04/22/2020   Lab Results  Component Value Date   INSULIN 6.8 02/04/2021   INSULIN 6.0 10/10/2020   INSULIN 10.7 04/22/2020   No results found  for: TSH Lab Results  Component Value Date   CHOL 201 (H) 07/14/2021   HDL 85 07/14/2021   LDLCALC 108 (H) 07/14/2021   TRIG 41 07/14/2021   Lab Results  Component Value Date   VD25OH 81.9 07/14/2021   VD25OH 71.5 02/04/2021   VD25OH 50.0 10/10/2020   No results found for: WBC, HGB, HCT, MCV, PLT No results found for: IRON, TIBC, FERRITIN  Attestation Statements:   Reviewed by clinician on day of visit:  allergies, medications, problem list, medical history, surgical history, family history, social history, and previous encounter notes.  I, Lizbeth Bark, RMA, am acting as Location manager for [employer], DO.   I have reviewed the above documentation for accuracy and completeness, and I agree with the above. Jearld Lesch, DO

## 2021-09-02 ENCOUNTER — Encounter (INDEPENDENT_AMBULATORY_CARE_PROVIDER_SITE_OTHER): Payer: Self-pay | Admitting: Bariatrics

## 2021-09-04 DIAGNOSIS — I8312 Varicose veins of left lower extremity with inflammation: Secondary | ICD-10-CM | POA: Diagnosis not present

## 2021-09-11 ENCOUNTER — Other Ambulatory Visit: Payer: Self-pay | Admitting: Surgery

## 2021-09-11 DIAGNOSIS — E042 Nontoxic multinodular goiter: Secondary | ICD-10-CM

## 2021-09-11 DIAGNOSIS — E049 Nontoxic goiter, unspecified: Secondary | ICD-10-CM

## 2021-09-17 ENCOUNTER — Other Ambulatory Visit (HOSPITAL_COMMUNITY)
Admission: RE | Admit: 2021-09-17 | Discharge: 2021-09-17 | Disposition: A | Discharge status: Home / Self Care | Payer: BC Managed Care – PPO | Source: Ambulatory Visit | Attending: Surgery | Admitting: Surgery

## 2021-09-17 ENCOUNTER — Ambulatory Visit
Admission: RE | Admit: 2021-09-17 | Discharge: 2021-09-17 | Disposition: A | Discharge status: Home / Self Care | Payer: BC Managed Care – PPO | Source: Ambulatory Visit | Attending: Surgery | Admitting: Surgery

## 2021-09-17 DIAGNOSIS — E049 Nontoxic goiter, unspecified: Secondary | ICD-10-CM

## 2021-09-17 DIAGNOSIS — E041 Nontoxic single thyroid nodule: Secondary | ICD-10-CM | POA: Diagnosis not present

## 2021-09-17 DIAGNOSIS — D34 Benign neoplasm of thyroid gland: Secondary | ICD-10-CM | POA: Insufficient documentation

## 2021-09-17 DIAGNOSIS — E042 Nontoxic multinodular goiter: Secondary | ICD-10-CM

## 2021-09-18 DIAGNOSIS — I8312 Varicose veins of left lower extremity with inflammation: Secondary | ICD-10-CM | POA: Diagnosis not present

## 2021-09-18 LAB — CYTOLOGY - NON PAP

## 2021-09-22 ENCOUNTER — Encounter (INDEPENDENT_AMBULATORY_CARE_PROVIDER_SITE_OTHER): Payer: Self-pay

## 2021-09-22 ENCOUNTER — Telehealth (INDEPENDENT_AMBULATORY_CARE_PROVIDER_SITE_OTHER): Payer: Self-pay | Admitting: Bariatrics

## 2021-09-22 NOTE — Telephone Encounter (Signed)
Prior authorization approved for Saxenda. Patient sent mychart message

## 2021-10-01 NOTE — Progress Notes (Signed)
Good news!  Thyroid biopsy is benign.  Logan, MD [facility] [place] practice Office: [phone]

## 2021-10-09 DIAGNOSIS — H40019 Open angle with borderline findings, low risk, unspecified eye: Secondary | ICD-10-CM | POA: Diagnosis not present

## 2021-10-13 ENCOUNTER — Other Ambulatory Visit: Payer: Self-pay

## 2021-10-13 ENCOUNTER — Encounter (INDEPENDENT_AMBULATORY_CARE_PROVIDER_SITE_OTHER): Payer: Self-pay | Admitting: Bariatrics

## 2021-10-13 ENCOUNTER — Ambulatory Visit (INDEPENDENT_AMBULATORY_CARE_PROVIDER_SITE_OTHER): Payer: BC Managed Care – PPO | Admitting: Bariatrics

## 2021-10-13 VITALS — BP 146/84 | HR 63 | Temp 97.9°F | Ht 68.0 in | Wt 200.0 lb

## 2021-10-13 DIAGNOSIS — Z6830 Body mass index (BMI) 30.0-30.9, adult: Secondary | ICD-10-CM | POA: Diagnosis not present

## 2021-10-13 DIAGNOSIS — E7800 Pure hypercholesterolemia, unspecified: Secondary | ICD-10-CM

## 2021-10-13 DIAGNOSIS — R632 Polyphagia: Secondary | ICD-10-CM

## 2021-10-13 MED ORDER — INSULIN PEN NEEDLE 32G X 4 MM MISC: 0 refills | Status: DC

## 2021-10-13 MED ORDER — SAXENDA 18 MG/3ML ~~LOC~~ SOPN: 2.4000 mg | PEN_INJECTOR | Freq: Every day | SUBCUTANEOUS | 0 refills | Status: DC

## 2021-10-14 ENCOUNTER — Encounter (INDEPENDENT_AMBULATORY_CARE_PROVIDER_SITE_OTHER): Payer: Self-pay | Admitting: Bariatrics

## 2021-10-14 NOTE — Progress Notes (Signed)
Chief Complaint:   OBESITY Jessica Drake is here to discuss her progress with her obesity treatment plan along with follow-up of her obesity related diagnoses. Jessica Drake is on following a lower carbohydrate, vegetable and lean protein rich diet plan and states she is following her eating plan approximately 30% of the time. Jessica Drake states she is doing 0 minutes 0 times per week.  Today's visit was #: 25 Starting weight: 244 lbs Starting date: 04/22/2020 Today's weight: 200 lbs Today's date: 10/14/2021 Total lbs lost to date: 44 lbs Total lbs lost since last in-office visit: 0  Interim History: Jessica Drake is up 1 lb over the holidays. She is on low carb plan.  Subjective:   1. Polyphagia Jessica Drake is taking [place] currently. She states her appetite is controlled. She notes cravings for sweets.  2. Elevated cholesterol Jessica Drake is currently not taking medications.   Assessment/Plan:   1. Polyphagia Intensive lifestyle modifications are the first line treatment for this issue. We discussed several lifestyle modifications today and she will continue to work on diet, exercise and weight loss efforts. Jessica Drake will increase water and protein intake. Orders and follow up as documented in patient record.  Counseling Polyphagia is excessive hunger. Causes can include: low blood sugars, hypERthyroidism, PMS, lack of sleep, stress, insulin resistance, diabetes, certain medications, and diets that are deficient in protein and fiber.    2. Elevated cholesterol Cardiovascular risk and specific lipid/LDL goals reviewed.  We discussed several lifestyle modifications today and Jessica Drake will continue to work on diet, exercise and weight loss efforts. Jessica Drake will have no trans fats. She will keep saturated fats to a minimum. Orders and follow up as documented in patient record.   Counseling Intensive lifestyle modifications are the first line treatment for this issue. Dietary changes: Increase soluble fiber.  Decrease simple carbohydrates. Exercise changes: Moderate to vigorous-intensity aerobic activity 150 minutes per week if tolerated. Lipid-lowering medications: see documented in medical record.  3. Obesity with current BMI of 30.5 Jessica Drake is currently in the action stage of change. As such, her goal is to continue with weight loss efforts. She has agreed to following a lower carbohydrate, vegetable and lean protein rich diet plan.   We will refill Saxenda 18 mg with no refills. We will refill Insulin Pen Needles 32 G  with no refills.   - Liraglutide -Weight Management (SAXENDA) 18 MG/3ML SOPN; Inject 2.4 mg into the skin daily. And taper over time  Dispense: 15 mL; Refill: 0  - Insulin Pen Needle 32G X 4 MM MISC; Use daily with Saxenda  Dispense: 100 each; Refill: 0  Exercise goals: No exercise has been prescribed at this time.  Behavioral modification strategies: increasing lean protein intake, decreasing simple carbohydrates, increasing vegetables, increasing water intake, decreasing eating out, no skipping meals, meal planning and cooking strategies, keeping healthy foods in the home, and planning for success.  Jessica Drake has agreed to follow-up with our clinic in 3-4 weeks. She was informed of the importance of frequent follow-up visits to maximize her success with intensive lifestyle modifications for her multiple health conditions.   Objective:   Blood pressure (!) 146/84, pulse 63, temperature 97.9 F (36.6 C), height 5\' 8"  (1.727 m), weight 200 lb (90.7 kg), SpO2 100 %. Body mass index is 30.41 kg/m.  General: Cooperative, alert, well developed, in no acute distress. HEENT: Conjunctivae and lids unremarkable. Cardiovascular: Regular rhythm.  Lungs: Normal work of breathing. Neurologic: No focal deficits.   Lab Results  Component Value  Date   CREATININE 0.91 07/14/2021   BUN 20 07/14/2021   NA 142 07/14/2021   K 4.7 07/14/2021   CL 107 (H) 07/14/2021   CO2 19 (L) 07/14/2021    Lab Results  Component Value Date   ALT 14 07/14/2021   AST 16 07/14/2021   ALKPHOS 54 07/14/2021   BILITOT 0.5 07/14/2021   Lab Results  Component Value Date   HGBA1C 5.4 02/04/2021   HGBA1C 5.4 10/10/2020   HGBA1C 5.7 (H) 04/22/2020   Lab Results  Component Value Date   INSULIN 6.8 02/04/2021   INSULIN 6.0 10/10/2020   INSULIN 10.7 04/22/2020   No results found for: TSH Lab Results  Component Value Date   CHOL 201 (H) 07/14/2021   HDL 85 07/14/2021   LDLCALC 108 (H) 07/14/2021   TRIG 41 07/14/2021   Lab Results  Component Value Date   VD25OH 81.9 07/14/2021   VD25OH 71.5 02/04/2021   VD25OH 50.0 10/10/2020   No results found for: WBC, HGB, HCT, MCV, PLT No results found for: IRON, TIBC, FERRITIN  Attestation Statements:   Reviewed by clinician on day of visit: allergies, medications, problem list, medical history, surgical history, family history, social history, and previous encounter notes.  I, Lizbeth Bark, RMA, am acting as Location manager for [employer], DO.  I have reviewed the above documentation for accuracy and completeness, and I agree with the above. Jearld Lesch, DO

## 2021-11-10 ENCOUNTER — Encounter (INDEPENDENT_AMBULATORY_CARE_PROVIDER_SITE_OTHER): Payer: Self-pay | Admitting: Bariatrics

## 2021-11-10 ENCOUNTER — Other Ambulatory Visit: Payer: Self-pay

## 2021-11-10 ENCOUNTER — Ambulatory Visit (INDEPENDENT_AMBULATORY_CARE_PROVIDER_SITE_OTHER): Payer: BC Managed Care – PPO | Admitting: Bariatrics

## 2021-11-10 VITALS — BP 144/78 | HR 71 | Temp 98.3°F | Ht 68.0 in | Wt 204.0 lb

## 2021-11-10 DIAGNOSIS — I10 Essential (primary) hypertension: Secondary | ICD-10-CM | POA: Diagnosis not present

## 2021-11-10 DIAGNOSIS — E669 Obesity, unspecified: Secondary | ICD-10-CM

## 2021-11-10 DIAGNOSIS — Z6831 Body mass index (BMI) 31.0-31.9, adult: Secondary | ICD-10-CM

## 2021-11-10 DIAGNOSIS — E7800 Pure hypercholesterolemia, unspecified: Secondary | ICD-10-CM | POA: Diagnosis not present

## 2021-11-10 MED ORDER — SAXENDA 18 MG/3ML ~~LOC~~ SOPN: 3.0000 mg | PEN_INJECTOR | Freq: Every day | SUBCUTANEOUS | 0 refills | Status: DC

## 2021-11-10 NOTE — Progress Notes (Signed)
Chief Complaint:   OBESITY Jessica Drake is here to discuss her progress with her obesity treatment plan along with follow-up of her obesity related diagnoses. Jessica Drake is on the Category 1 Plan and states she is following her eating plan approximately 40% of the time. Jessica Drake states she is doing weight training for 45 minutes 2 times per week.  Today's visit was #: 89 Starting weight: 244 lbs Starting date: 04/22/2020 Today's weight: 204 lbs Today's date: 11/10/2021 Total lbs lost to date: 40 lbs Total lbs lost since last in-office visit: 0  Interim History: Jessica Drake is up 4 lbs since her last visit. She is taking [place]. She has been struggling.  Subjective:   1. Essential hypertension Jessica Drake's blood pressure is controlled. Her last blood pressure was 146/84.  2. Elevated cholesterol Jessica Drake is not on medications currently.  Assessment/Plan:   1. Essential hypertension Jessica Drake l will continue her medications. She is working on healthy weight loss and exercise to improve blood pressure control. We will watch for signs of hypotension as she continues her lifestyle modifications.  2. Elevated cholesterol Cardiovascular risk and specific lipid/LDL goals reviewed.  We discussed several lifestyle modifications today and Jessica Drake will continue to follow the plan and exercise. Orders and follow up as documented in patient record.   Counseling Intensive lifestyle modifications are the first line treatment for this issue. Dietary changes: Increase soluble fiber. Decrease simple carbohydrates. Exercise changes: Moderate to vigorous-intensity aerobic activity 150 minutes per week if tolerated. Lipid-lowering medications: see documented in medical record.  3. Obesity with current BMI of 31.0 Jessica Drake is currently in the action stage of change. As such, her goal is to continue with weight loss efforts. She has agreed to the Category 1 Plan.   Exercise goals:  Jessica Drake will increase Saxenda 3 mg  daily.  - Liraglutide -Weight Management (SAXENDA) 18 MG/3ML SOPN; Inject 3 mg into the skin daily. And taper over time  Dispense: 45 mL; Refill: 0  Behavioral modification strategies: increasing lean protein intake, decreasing simple carbohydrates, increasing vegetables, increasing water intake, decreasing eating out, no skipping meals, meal planning and cooking strategies, keeping healthy foods in the home, and planning for success.  Jessica Drake has agreed to follow-up with our clinic in 3-4 weeks. She was informed of the importance of frequent follow-up visits to maximize her success with intensive lifestyle modifications for her multiple health conditions.   Objective:   Blood pressure (!) 144/78, pulse 71, temperature 98.3 F (36.8 C), height 5\' 8"  (1.727 m), weight 204 lb (92.5 kg), SpO2 100 %. Body mass index is 31.02 kg/m.  General: Cooperative, alert, well developed, in no acute distress. HEENT: Conjunctivae and lids unremarkable. Cardiovascular: Regular rhythm.  Lungs: Normal work of breathing. Neurologic: No focal deficits.   Lab Results  Component Value Date   CREATININE 0.91 07/14/2021   BUN 20 07/14/2021   NA 142 07/14/2021   K 4.7 07/14/2021   CL 107 (H) 07/14/2021   CO2 19 (L) 07/14/2021   Lab Results  Component Value Date   ALT 14 07/14/2021   AST 16 07/14/2021   ALKPHOS 54 07/14/2021   BILITOT 0.5 07/14/2021   Lab Results  Component Value Date   HGBA1C 5.4 02/04/2021   HGBA1C 5.4 10/10/2020   HGBA1C 5.7 (H) 04/22/2020   Lab Results  Component Value Date   INSULIN 6.8 02/04/2021   INSULIN 6.0 10/10/2020   INSULIN 10.7 04/22/2020   No results found for: TSH Lab Results  Component  Value Date   CHOL 201 (H) 07/14/2021   HDL 85 07/14/2021   LDLCALC 108 (H) 07/14/2021   TRIG 41 07/14/2021   Lab Results  Component Value Date   VD25OH 81.9 07/14/2021   VD25OH 71.5 02/04/2021   VD25OH 50.0 10/10/2020   No results found for: WBC, HGB, HCT, MCV,  PLT No results found for: IRON, TIBC, FERRITIN  Attestation Statements:   Reviewed by clinician on day of visit: allergies, medications, problem list, medical history, surgical history, family history, social history, and previous encounter notes.  I, Lizbeth Bark, RMA, am acting as Location manager for [employer], DO.  I have reviewed the above documentation for accuracy and completeness, and I agree with the above. Jearld Lesch, DO

## 2021-11-11 ENCOUNTER — Encounter (INDEPENDENT_AMBULATORY_CARE_PROVIDER_SITE_OTHER): Payer: Self-pay | Admitting: Bariatrics

## 2021-12-08 ENCOUNTER — Ambulatory Visit (INDEPENDENT_AMBULATORY_CARE_PROVIDER_SITE_OTHER): Payer: BC Managed Care – PPO | Admitting: Bariatrics

## 2021-12-08 ENCOUNTER — Encounter (INDEPENDENT_AMBULATORY_CARE_PROVIDER_SITE_OTHER): Payer: Self-pay | Admitting: Bariatrics

## 2021-12-08 ENCOUNTER — Other Ambulatory Visit: Payer: Self-pay

## 2021-12-08 VITALS — BP 132/84 | HR 72 | Temp 98.1°F | Ht 68.0 in | Wt 204.0 lb

## 2021-12-08 DIAGNOSIS — Z6831 Body mass index (BMI) 31.0-31.9, adult: Secondary | ICD-10-CM

## 2021-12-08 DIAGNOSIS — E669 Obesity, unspecified: Secondary | ICD-10-CM

## 2021-12-08 DIAGNOSIS — R7303 Prediabetes: Secondary | ICD-10-CM

## 2021-12-08 DIAGNOSIS — R5383 Other fatigue: Secondary | ICD-10-CM

## 2021-12-08 DIAGNOSIS — R0602 Shortness of breath: Secondary | ICD-10-CM

## 2021-12-08 DIAGNOSIS — Z6834 Body mass index (BMI) 34.0-34.9, adult: Secondary | ICD-10-CM

## 2021-12-08 NOTE — Progress Notes (Signed)
? ? ? ?Chief Complaint:  ? ?OBESITY ?Jessica Drake is here to discuss her progress with her obesity treatment plan along with follow-up of her obesity related diagnoses. Jessica Drake is on the Category 1 Plan and the Category 2 Plan and states she is following her eating plan approximately 65% of the time. Jessica Drake states she is walking for 60 minutes 4 times per week. ? ?Today's visit was #: 34 ?Starting weight: 244 lbs ?Starting date: 04/22/2020 ?Today's weight: 204 lbs ?Today's date: 12/08/2021 ?Total lbs lost to date: 40 lbs ?Total lbs lost since last in-office visit: 0 ? ?Interim History: Jessica Drake's weight remains the same as her last visit. She is taking [place]. She states that she is not back on track.  ? ?Subjective:  ? ?1. Other fatigue/SOB (shortness of breath) on exertion ?Jessica Drake's last RMR on 10/08/2020 was 1375. Today her RMR was 2102. ? ?2. Pre-diabetes ?Jessica Drake is taking [place] currently.  ? ?Assessment/Plan:  ? ?1. Other fatigue/SOB (shortness of breath) on exertion ?We discussed RMR and implications. Jessica Drake does feel that her weight is causing her energy to be lower than it should be. Fatigue may be related to obesity, depression or many other causes. Labs will be ordered, and in the meanwhile, Jessica Drake will focus on self care including making healthy food choices, increasing physical activity and focusing on stress reduction.  ? ?2. Pre-diabetes ?Jessica Drake will start on plan 80-90%. She will change to Plan 3. continue to work on weight loss, exercise, and decreasing simple carbohydrates to help decrease the risk of diabetes.  ? ?3. Obesity with current BMI 31.1 ?Jessica Drake is currently in the action stage of change. As such, her goal is to continue with weight loss efforts. She has agreed to the Category 3 Plan.  ? ?Jessica Drake will continue meal planning and she will continue intentional eating. She will continue taking Saxenda. She will watch portion control and she will weigh her meat using meat cup. She will increase protein  and decrease carbohydrates.  ? ?Exercise goals:  As is.  ? ?Behavioral modification strategies: increasing lean protein intake, decreasing simple carbohydrates, increasing vegetables, increasing water intake, decreasing eating out, no skipping meals, meal planning and cooking strategies, keeping healthy foods in the home, and planning for success. ? ?Jessica Drake has agreed to follow-up with our clinic in 3-4 weeks. She was informed of the importance of frequent follow-up visits to maximize her success with intensive lifestyle modifications for her multiple health conditions.  ? ?Objective:  ? ?Blood pressure 132/84, pulse 72, temperature 98.1 ?F (36.7 ?C), height '5\' 8"'$  (1.727 m), weight 204 lb (92.5 kg), SpO2 99 %. ?Body mass index is 31.02 kg/m?. ? ?General: Cooperative, alert, well developed, in no acute distress. ?HEENT: Conjunctivae and lids unremarkable. ?Cardiovascular: Regular rhythm.  ?Lungs: Normal work of breathing. ?Neurologic: No focal deficits.  ? ?Lab Results  ?Component Value Date  ? CREATININE 0.91 07/14/2021  ? BUN 20 07/14/2021  ? NA 142 07/14/2021  ? K 4.7 07/14/2021  ? CL 107 (H) 07/14/2021  ? CO2 19 (L) 07/14/2021  ? ?Lab Results  ?Component Value Date  ? ALT 14 07/14/2021  ? AST 16 07/14/2021  ? ALKPHOS 54 07/14/2021  ? BILITOT 0.5 07/14/2021  ? ?Lab Results  ?Component Value Date  ? HGBA1C 5.4 02/04/2021  ? HGBA1C 5.4 10/10/2020  ? HGBA1C 5.7 (H) 04/22/2020  ? ?Lab Results  ?Component Value Date  ? INSULIN 6.8 02/04/2021  ? INSULIN 6.0 10/10/2020  ? INSULIN 10.7 04/22/2020  ? ?  No results found for: TSH ?Lab Results  ?Component Value Date  ? CHOL 201 (H) 07/14/2021  ? HDL 85 07/14/2021  ? LDLCALC 108 (H) 07/14/2021  ? TRIG 41 07/14/2021  ? ?Lab Results  ?Component Value Date  ? VD25OH 81.9 07/14/2021  ? VD25OH 71.5 02/04/2021  ? VD25OH 50.0 10/10/2020  ? ?No results found for: WBC, HGB, HCT, MCV, PLT ?No results found for: IRON, TIBC, FERRITIN ? ?Attestation Statements:  ? ?Reviewed by clinician on  day of visit: allergies, medications, problem list, medical history, surgical history, family history, social history, and previous encounter notes. ? ?Time spent on visit including pre-visit chart review and post-visit care and charting was 30 minutes.  ? ?I, Lizbeth Bark, RMA, am acting as transcriptionist for [employer], DO. ? ?I have reviewed the above documentation for accuracy and completeness, and I agree with the above. Jearld Lesch, DO ? ?

## 2021-12-12 DIAGNOSIS — I8311 Varicose veins of right lower extremity with inflammation: Secondary | ICD-10-CM | POA: Diagnosis not present

## 2021-12-15 DIAGNOSIS — I8312 Varicose veins of left lower extremity with inflammation: Secondary | ICD-10-CM | POA: Diagnosis not present

## 2021-12-15 DIAGNOSIS — I8311 Varicose veins of right lower extremity with inflammation: Secondary | ICD-10-CM | POA: Diagnosis not present

## 2021-12-26 DIAGNOSIS — M7981 Nontraumatic hematoma of soft tissue: Secondary | ICD-10-CM | POA: Diagnosis not present

## 2021-12-26 DIAGNOSIS — I8311 Varicose veins of right lower extremity with inflammation: Secondary | ICD-10-CM | POA: Diagnosis not present

## 2021-12-30 ENCOUNTER — Encounter (INDEPENDENT_AMBULATORY_CARE_PROVIDER_SITE_OTHER): Payer: Self-pay | Admitting: Bariatrics

## 2021-12-30 ENCOUNTER — Other Ambulatory Visit: Payer: Self-pay

## 2021-12-30 ENCOUNTER — Ambulatory Visit (INDEPENDENT_AMBULATORY_CARE_PROVIDER_SITE_OTHER): Payer: BC Managed Care – PPO | Admitting: Bariatrics

## 2021-12-30 VITALS — BP 134/75 | HR 80 | Temp 97.6°F | Ht 68.0 in | Wt 203.0 lb

## 2021-12-30 DIAGNOSIS — Z6831 Body mass index (BMI) 31.0-31.9, adult: Secondary | ICD-10-CM

## 2021-12-30 DIAGNOSIS — I10 Essential (primary) hypertension: Secondary | ICD-10-CM | POA: Diagnosis not present

## 2021-12-30 DIAGNOSIS — E669 Obesity, unspecified: Secondary | ICD-10-CM | POA: Diagnosis not present

## 2021-12-30 DIAGNOSIS — R7303 Prediabetes: Secondary | ICD-10-CM

## 2021-12-31 ENCOUNTER — Encounter (INDEPENDENT_AMBULATORY_CARE_PROVIDER_SITE_OTHER): Payer: Self-pay | Admitting: Bariatrics

## 2021-12-31 NOTE — Progress Notes (Signed)
? ? ? ?Chief Complaint:  ? ?OBESITY ?Jessica Drake is here to discuss her progress with her obesity treatment plan along with follow-up of her obesity related diagnoses. Jessica Drake is on the Category 2 Plan and states she is following her eating plan approximately 75% of the time. Jessica Drake states she is doing 0 minutes 0 times per week. ? ?Today's visit was #: 28 ?Starting weight: 244 lbs ?Starting date: 04/22/2020 ?Today's weight: 203 lbs ?Today's date: 12/30/2021 ?Total lbs lost to date: 41 lbs ?Total lbs lost since last in-office visit: 1 lb ? ?Interim History: Jessica Drake is down 1 additional pound since her last visit. She is taking [place] currently.  ? ?Subjective:  ? ?1. Essential hypertension ?Jessica Drake's blood pressure is controlled. Her last blood pressure was 132/84. ? ?2. Prediabetes ?Jessica Drake is not on medications currently.  ? ?Assessment/Plan:  ? ?1. Essential hypertension ?Jessica Drake will continue her medications. She is working on healthy weight loss and exercise to improve blood pressure control. We will watch for signs of hypotension as she continues her lifestyle modifications. ? ?2. Prediabetes ?Jessica Drake will minimize her carbohydrates (starches and sweets). She will continue to work on weight loss, exercise, and decreasing simple carbohydrates to help decrease the risk of diabetes.  ? ?3. Obesity with current BMI 31.0 ?Jessica Drake is currently in the action stage of change. As such, her goal is to continue with weight loss efforts. She has agreed to the Category 2 Plan.  ? ?Jessica Drake will continue Saxenda. She will increase her water intake.  ? ?Exercise goals:  Jessica Drake is wearing support hose for veins. She is doing some walking.  ? ?Behavioral modification strategies: increasing lean protein intake, decreasing simple carbohydrates, increasing vegetables, increasing water intake, decreasing eating out, no skipping meals, meal planning and cooking strategies, keeping healthy foods in the home, and planning for success. ? ?Jessica Drake  has agreed to follow-up with our clinic in 4 weeks (fasting). She was informed of the importance of frequent follow-up visits to maximize her success with intensive lifestyle modifications for her multiple health conditions.  ? ?Objective:  ? ?Blood pressure 134/75, pulse 80, temperature 97.6 ?F (36.4 ?C), height '5\' 8"'$  (1.727 m), weight 203 lb (92.1 kg), SpO2 100 %. ?Body mass index is 30.87 kg/m?. ? ?General: Cooperative, alert, well developed, in no acute distress. ?HEENT: Conjunctivae and lids unremarkable. ?Cardiovascular: Regular rhythm.  ?Lungs: Normal work of breathing. ?Neurologic: No focal deficits.  ? ?Lab Results  ?Component Value Date  ? CREATININE 0.91 07/14/2021  ? BUN 20 07/14/2021  ? NA 142 07/14/2021  ? K 4.7 07/14/2021  ? CL 107 (H) 07/14/2021  ? CO2 19 (L) 07/14/2021  ? ?Lab Results  ?Component Value Date  ? ALT 14 07/14/2021  ? AST 16 07/14/2021  ? ALKPHOS 54 07/14/2021  ? BILITOT 0.5 07/14/2021  ? ?Lab Results  ?Component Value Date  ? HGBA1C 5.4 02/04/2021  ? HGBA1C 5.4 10/10/2020  ? HGBA1C 5.7 (H) 04/22/2020  ? ?Lab Results  ?Component Value Date  ? INSULIN 6.8 02/04/2021  ? INSULIN 6.0 10/10/2020  ? INSULIN 10.7 04/22/2020  ? ?No results found for: TSH ?Lab Results  ?Component Value Date  ? CHOL 201 (H) 07/14/2021  ? HDL 85 07/14/2021  ? LDLCALC 108 (H) 07/14/2021  ? TRIG 41 07/14/2021  ? ?Lab Results  ?Component Value Date  ? VD25OH 81.9 07/14/2021  ? VD25OH 71.5 02/04/2021  ? VD25OH 50.0 10/10/2020  ? ?No results found for: WBC, HGB, HCT, MCV, PLT ?No results  found for: IRON, TIBC, FERRITIN ? ?Attestation Statements:  ? ?Reviewed by clinician on day of visit: allergies, medications, problem list, medical history, surgical history, family history, social history, and previous encounter notes. ? ?I, Lizbeth Bark, RMA, am acting as transcriptionist for [employer], DO. ? ?I have reviewed the above documentation for accuracy and completeness, and I agree with the above. Jearld Lesch, DO ? ?

## 2022-01-06 DIAGNOSIS — I8311 Varicose veins of right lower extremity with inflammation: Secondary | ICD-10-CM | POA: Diagnosis not present

## 2022-01-23 ENCOUNTER — Other Ambulatory Visit (INDEPENDENT_AMBULATORY_CARE_PROVIDER_SITE_OTHER): Payer: Self-pay | Admitting: Bariatrics

## 2022-01-23 DIAGNOSIS — E66812 Obesity, class 2: Secondary | ICD-10-CM

## 2022-01-27 DIAGNOSIS — Z01419 Encounter for gynecological examination (general) (routine) without abnormal findings: Secondary | ICD-10-CM | POA: Diagnosis not present

## 2022-01-27 DIAGNOSIS — L682 Localized hypertrichosis: Secondary | ICD-10-CM | POA: Diagnosis not present

## 2022-01-27 DIAGNOSIS — Z6832 Body mass index (BMI) 32.0-32.9, adult: Secondary | ICD-10-CM | POA: Diagnosis not present

## 2022-01-27 DIAGNOSIS — Z1231 Encounter for screening mammogram for malignant neoplasm of breast: Secondary | ICD-10-CM | POA: Diagnosis not present

## 2022-01-27 DIAGNOSIS — L689 Hypertrichosis, unspecified: Secondary | ICD-10-CM | POA: Diagnosis not present

## 2022-01-30 DIAGNOSIS — I8311 Varicose veins of right lower extremity with inflammation: Secondary | ICD-10-CM | POA: Diagnosis not present

## 2022-02-04 ENCOUNTER — Ambulatory Visit (INDEPENDENT_AMBULATORY_CARE_PROVIDER_SITE_OTHER): Payer: BC Managed Care – PPO | Admitting: Bariatrics

## 2022-03-10 ENCOUNTER — Encounter (INDEPENDENT_AMBULATORY_CARE_PROVIDER_SITE_OTHER): Payer: Self-pay | Admitting: Bariatrics

## 2022-03-10 ENCOUNTER — Ambulatory Visit (INDEPENDENT_AMBULATORY_CARE_PROVIDER_SITE_OTHER): Payer: BC Managed Care – PPO | Admitting: Bariatrics

## 2022-03-10 VITALS — BP 124/73 | HR 62 | Temp 97.8°F | Ht 68.0 in | Wt 216.0 lb

## 2022-03-10 DIAGNOSIS — E559 Vitamin D deficiency, unspecified: Secondary | ICD-10-CM | POA: Diagnosis not present

## 2022-03-10 DIAGNOSIS — I10 Essential (primary) hypertension: Secondary | ICD-10-CM

## 2022-03-10 DIAGNOSIS — E8881 Metabolic syndrome: Secondary | ICD-10-CM | POA: Diagnosis not present

## 2022-03-10 DIAGNOSIS — Z6832 Body mass index (BMI) 32.0-32.9, adult: Secondary | ICD-10-CM

## 2022-03-10 DIAGNOSIS — E669 Obesity, unspecified: Secondary | ICD-10-CM | POA: Diagnosis not present

## 2022-03-10 DIAGNOSIS — E88819 Insulin resistance, unspecified: Secondary | ICD-10-CM | POA: Insufficient documentation

## 2022-03-10 DIAGNOSIS — Z7985 Long-term (current) use of injectable non-insulin antidiabetic drugs: Secondary | ICD-10-CM

## 2022-03-10 MED ORDER — SAXENDA 18 MG/3ML ~~LOC~~ SOPN: 3.0000 mg | PEN_INJECTOR | Freq: Every day | SUBCUTANEOUS | 0 refills | Status: DC

## 2022-03-11 ENCOUNTER — Encounter (INDEPENDENT_AMBULATORY_CARE_PROVIDER_SITE_OTHER): Payer: Self-pay

## 2022-03-11 ENCOUNTER — Encounter (INDEPENDENT_AMBULATORY_CARE_PROVIDER_SITE_OTHER): Payer: Self-pay | Admitting: Bariatrics

## 2022-03-11 ENCOUNTER — Telehealth (INDEPENDENT_AMBULATORY_CARE_PROVIDER_SITE_OTHER): Payer: Self-pay | Admitting: Bariatrics

## 2022-03-11 DIAGNOSIS — E7800 Pure hypercholesterolemia, unspecified: Secondary | ICD-10-CM | POA: Insufficient documentation

## 2022-03-11 LAB — COMPREHENSIVE METABOLIC PANEL
ALT: 19 IU/L (ref 0–32)
AST: 22 IU/L (ref 0–40)
Albumin/Globulin Ratio: 2.1 (ref 1.2–2.2)
Albumin: 4.6 g/dL (ref 3.8–4.8)
Alkaline Phosphatase: 60 IU/L (ref 44–121)
BUN/Creatinine Ratio: 17 (ref 12–28)
BUN: 16 mg/dL (ref 8–27)
Bilirubin Total: 0.5 mg/dL (ref 0.0–1.2)
CO2: 20 mmol/L (ref 20–29)
Calcium: 9.5 mg/dL (ref 8.7–10.3)
Chloride: 104 mmol/L (ref 96–106)
Creatinine, Ser: 0.94 mg/dL (ref 0.57–1.00)
Globulin, Total: 2.2 g/dL (ref 1.5–4.5)
Glucose: 92 mg/dL (ref 70–99)
Potassium: 5 mmol/L (ref 3.5–5.2)
Sodium: 143 mmol/L (ref 134–144)
Total Protein: 6.8 g/dL (ref 6.0–8.5)
eGFR: 69 mL/min/{1.73_m2} (ref >59)

## 2022-03-11 LAB — LIPID PANEL WITH LDL/HDL RATIO
Cholesterol, Total: 238 mg/dL — ABNORMAL HIGH (ref 100–199)
HDL: 111 mg/dL (ref >39)
LDL Chol Calc (NIH): 118 mg/dL — ABNORMAL HIGH (ref 0–99)
LDL/HDL Ratio: 1.1 ratio (ref 0.0–3.2)
Triglycerides: 56 mg/dL (ref 0–149)
VLDL Cholesterol Cal: 9 mg/dL (ref 5–40)

## 2022-03-11 LAB — INSULIN, RANDOM: INSULIN: 5.7 u[IU]/mL (ref 2.6–24.9)

## 2022-03-11 LAB — HEMOGLOBIN A1C
Est. average glucose Bld gHb Est-mCnc: 105 mg/dL
Hgb A1c MFr Bld: 5.3 % (ref 4.8–5.6)

## 2022-03-11 LAB — VITAMIN D 25 HYDROXY (VIT D DEFICIENCY, FRACTURES): Vit D, 25-Hydroxy: 65.3 ng/mL (ref 30.0–100.0)

## 2022-03-11 NOTE — Telephone Encounter (Signed)
Dr. Owens Shark - Prior authorization approved for Saxenda. Effective: 03/10/2022 - 09/09/2022. Patient sent approval message via mychart.

## 2022-03-11 NOTE — Progress Notes (Signed)
Chief Complaint:   OBESITY Jessica Drake is here to discuss her progress with her obesity treatment plan along with follow-up of her obesity related diagnoses. Jessica Drake is on the Category 2 Plan and states she is following her eating plan approximately 30% of the time. Jessica Drake states she is doing 0 minutes 0 times per week.  Today's visit was #: 29 Starting weight: 244 lbs Starting date: 04/22/2020 Today's weight: 216 lbs Today's date:03/10/2022 Total lbs lost to date: 28 lbs Total lbs lost since last in-office visit: 0  Interim History: Jessica Drake is up 13 lbs since her last visit. She is taking [place].   Subjective:   1. Essential hypertension Jessica Drake is currently taking Cozaar. Her blood pressure is controlled. Her blood pressure today is 124/73.  2. Vitamin D deficiency Jessica Drake is taking Calcium and Vitamin D currently.   3. Insulin resistance Jessica Drake's A1C previously was 5.7. Insulin was 10.7.  Assessment/Plan:   1. Essential hypertension Jessica Drake will continue medications. She will have no added salt. We will check CMP, and Lipid panel today. She is working on healthy weight loss and exercise to improve blood pressure control. We will watch for signs of hypotension as she continues her lifestyle modifications.  - Lipid Panel With LDL/HDL Ratio - Comprehensive metabolic panel  2. Vitamin D deficiency Low Vitamin D level contributes to fatigue and are associated with obesity, breast, and colon cancer. Jessica Drake agrees to continue to take Calcium and Vitamin D and she will follow-up for routine testing of Vitamin D, at least 2-3 times per year to avoid over-replacement. We will check Vitamin D today.   - VITAMIN D 25 Hydroxy (Vit-D Deficiency, Fractures)  3. Insulin resistance Jessica Drake will continue to work on weight loss, exercise, and decreasing simple carbohydrates to help decrease the risk of diabetes. Jessica Drake agreed to follow-up with [place] as directed to closely monitor her progress. We  will check A1C and insulin today.   - Insulin, random - Hemoglobin A1c  4. Obesity with current BMI of 32.9 Jessica Drake is currently in the action stage of change. As such, her goal is to continue with weight loss efforts. She has agreed to the Category 2 Plan.   Jessica Drake will continue meal planning and she will continue intentional eating. She will adhere to the plan. We will refill Saxenda 18 mg for 1 month with no refills.   - Liraglutide -Weight Management (SAXENDA) 18 MG/3ML SOPN; Inject 3 mg into the skin daily. And taper over time  Dispense: 45 mL; Refill: 0  Exercise goals: No exercise has been prescribed at this time.  Behavioral modification strategies: increasing lean protein intake, decreasing simple carbohydrates, increasing vegetables, increasing water intake, decreasing eating out, no skipping meals, meal planning and cooking strategies, keeping healthy foods in the home, and planning for success.  Jessica Drake has agreed to follow-up with our clinic in 4 weeks. She was informed of the importance of frequent follow-up visits to maximize her success with intensive lifestyle modifications for her multiple health conditions.   Jessica Drake was informed we would discuss her lab results at her next visit unless there is a critical issue that needs to be addressed sooner. Jessica Drake agreed to keep her next visit at the agreed upon time to discuss these results.  Objective:   Blood pressure 124/73, pulse 62, temperature 97.8 F (36.6 C), height '5\' 8"'$  (1.727 m), weight 216 lb (98 kg), SpO2 99 %. Body mass index is 32.84 kg/m.  General: Cooperative, alert, well developed,  in no acute distress. HEENT: Conjunctivae and lids unremarkable. Cardiovascular: Regular rhythm.  Lungs: Normal work of breathing. Neurologic: No focal deficits.   Lab Results  Component Value Date   CREATININE 0.91 07/14/2021   BUN 20 07/14/2021   NA 142 07/14/2021   K 4.7 07/14/2021   CL 107 (H) 07/14/2021   CO2 19 (L)  07/14/2021   Lab Results  Component Value Date   ALT 14 07/14/2021   AST 16 07/14/2021   ALKPHOS 54 07/14/2021   BILITOT 0.5 07/14/2021   Lab Results  Component Value Date   HGBA1C 5.4 02/04/2021   HGBA1C 5.4 10/10/2020   HGBA1C 5.7 (H) 04/22/2020   Lab Results  Component Value Date   INSULIN 6.8 02/04/2021   INSULIN 6.0 10/10/2020   INSULIN 10.7 04/22/2020   No results found for: TSH Lab Results  Component Value Date   CHOL 201 (H) 07/14/2021   HDL 85 07/14/2021   LDLCALC 108 (H) 07/14/2021   TRIG 41 07/14/2021   Lab Results  Component Value Date   VD25OH 81.9 07/14/2021   VD25OH 71.5 02/04/2021   VD25OH 50.0 10/10/2020   No results found for: WBC, HGB, HCT, MCV, PLT No results found for: IRON, TIBC, FERRITIN  Attestation Statements:   Reviewed by clinician on day of visit: allergies, medications, problem list, medical history, surgical history, family history, social history, and previous encounter notes.  I, Lizbeth Bark, RMA, am acting as Location manager for [employer], DO.  I have reviewed the above documentation for accuracy and completeness, and I agree with the above. Jearld Lesch, DO

## 2022-03-16 ENCOUNTER — Encounter (INDEPENDENT_AMBULATORY_CARE_PROVIDER_SITE_OTHER): Payer: Self-pay | Admitting: Bariatrics

## 2022-03-30 ENCOUNTER — Ambulatory Visit (INDEPENDENT_AMBULATORY_CARE_PROVIDER_SITE_OTHER): Payer: BC Managed Care – PPO | Admitting: Bariatrics

## 2022-03-30 ENCOUNTER — Encounter (INDEPENDENT_AMBULATORY_CARE_PROVIDER_SITE_OTHER): Payer: Self-pay | Admitting: Bariatrics

## 2022-03-30 VITALS — BP 116/72 | HR 60 | Temp 98.0°F | Ht 68.0 in | Wt 214.0 lb

## 2022-03-30 DIAGNOSIS — R7303 Prediabetes: Secondary | ICD-10-CM

## 2022-03-30 DIAGNOSIS — E669 Obesity, unspecified: Secondary | ICD-10-CM

## 2022-03-30 DIAGNOSIS — E7800 Pure hypercholesterolemia, unspecified: Secondary | ICD-10-CM | POA: Diagnosis not present

## 2022-03-30 DIAGNOSIS — Z6832 Body mass index (BMI) 32.0-32.9, adult: Secondary | ICD-10-CM | POA: Diagnosis not present

## 2022-03-30 MED ORDER — INSULIN PEN NEEDLE 32G X 4 MM MISC: 0 refills | Status: DC

## 2022-03-31 NOTE — Progress Notes (Signed)
Chief Complaint:   OBESITY Jessica Drake is here to discuss her progress with her obesity treatment plan along with follow-up of her obesity related diagnoses. Jessica Drake is on the Category 2 Plan and states she is following her eating plan approximately 75% of the time. Jessica Drake states she is lifting weights for 60 minutes 4 times per week.  Today's visit was #: 100 Starting weight: 244 lbs Starting date: 04/22/2020 Today's weight: 214 lbs Today's date: 03/30/2022 Total lbs lost to date: 30 Total lbs lost since last in-office visit: 2  Interim History: Jessica Drake is down an additional 2 pounds since her last visit.  She is taking [place].  She has been to the beach.  She is getting back in the groove.  Subjective:   1. Prediabetes Jessica Drake's last insulin level was at 5.3.  She is taking [place].  2. Elevated cholesterol Jessica Drake is currently not taking medications.   Assessment/Plan:   1. Prediabetes Jessica Drake will keep her carbohydrate is low.  2. Elevated cholesterol Jessica Drake will eliminate trans fats and minimize saturated fats, except for dairy, dark chocolate, and unprocessed meat.  3. Obesity with current BMI of 32.5 Jessica Drake is currently in the action stage of change. As such, her goal is to continue with weight loss efforts. She has agreed to the Category 2 Plan.   We will recheck fasting labs at her next visit.  Meal planning and intentional eating were discussed.  Snack sheet was given today.  Reviewed labs with the patient from 03/10/2022, CMP, lipid, vitamin D, A1c, and insulin.  We will refill pen needles #100.   - Insulin Pen Needle 32G X 4 MM MISC; Use daily with Saxenda  Dispense: 100 each; Refill: 0  Exercise goals: As is.   Behavioral modification strategies: increasing lean protein intake, decreasing simple carbohydrates, increasing vegetables, increasing water intake, decreasing eating out, no skipping meals, meal planning and cooking strategies, keeping healthy foods in the  home, and planning for success.  Jessica Drake has agreed to follow-up with our clinic in 4 weeks. She was informed of the importance of frequent follow-up visits to maximize her success with intensive lifestyle modifications for her multiple health conditions.   Objective:   Blood pressure 116/72, pulse 60, temperature 98 F (36.7 C), height '5\' 8"'$  (1.727 m), weight 214 lb (97.1 kg), SpO2 99 %. Body mass index is 32.54 kg/m.  General: Cooperative, alert, well developed, in no acute distress. HEENT: Conjunctivae and lids unremarkable. Cardiovascular: Regular rhythm.  Lungs: Normal work of breathing. Neurologic: No focal deficits.   Lab Results  Component Value Date   CREATININE 0.94 03/10/2022   BUN 16 03/10/2022   NA 143 03/10/2022   K 5.0 03/10/2022   CL 104 03/10/2022   CO2 20 03/10/2022   Lab Results  Component Value Date   ALT 19 03/10/2022   AST 22 03/10/2022   ALKPHOS 60 03/10/2022   BILITOT 0.5 03/10/2022   Lab Results  Component Value Date   HGBA1C 5.3 03/10/2022   HGBA1C 5.4 02/04/2021   HGBA1C 5.4 10/10/2020   HGBA1C 5.7 (H) 04/22/2020   Lab Results  Component Value Date   INSULIN 5.7 03/10/2022   INSULIN 6.8 02/04/2021   INSULIN 6.0 10/10/2020   INSULIN 10.7 04/22/2020   No results found for: "TSH" Lab Results  Component Value Date   CHOL 238 (H) 03/10/2022   HDL 111 03/10/2022   LDLCALC 118 (H) 03/10/2022   TRIG 56 03/10/2022   Lab Results  Component Value Date   VD25OH 65.3 03/10/2022   VD25OH 81.9 07/14/2021   VD25OH 71.5 02/04/2021   No results found for: "WBC", "HGB", "HCT", "MCV", "PLT" No results found for: "IRON", "TIBC", "FERRITIN"  Attestation Statements:   Reviewed by clinician on day of visit: allergies, medications, problem list, medical history, surgical history, family history, social history, and previous encounter notes.   Wilhemena Durie, am acting as Location manager for [employer], DO.  I have reviewed the above  documentation for accuracy and completeness, and I agree with the above. Jearld Lesch, DO

## 2022-04-01 ENCOUNTER — Encounter (INDEPENDENT_AMBULATORY_CARE_PROVIDER_SITE_OTHER): Payer: Self-pay | Admitting: Bariatrics

## 2022-04-28 ENCOUNTER — Encounter (INDEPENDENT_AMBULATORY_CARE_PROVIDER_SITE_OTHER): Payer: Self-pay | Admitting: Bariatrics

## 2022-04-28 ENCOUNTER — Ambulatory Visit (INDEPENDENT_AMBULATORY_CARE_PROVIDER_SITE_OTHER): Payer: BC Managed Care – PPO | Admitting: Bariatrics

## 2022-04-28 VITALS — BP 129/74 | HR 63 | Temp 97.6°F | Ht 68.0 in | Wt 217.0 lb

## 2022-04-28 DIAGNOSIS — E669 Obesity, unspecified: Secondary | ICD-10-CM

## 2022-04-28 DIAGNOSIS — Z6833 Body mass index (BMI) 33.0-33.9, adult: Secondary | ICD-10-CM

## 2022-04-28 DIAGNOSIS — E66812 Obesity, class 2: Secondary | ICD-10-CM

## 2022-04-28 DIAGNOSIS — E559 Vitamin D deficiency, unspecified: Secondary | ICD-10-CM | POA: Diagnosis not present

## 2022-04-28 DIAGNOSIS — I10 Essential (primary) hypertension: Secondary | ICD-10-CM

## 2022-05-04 DIAGNOSIS — Z0000 Encounter for general adult medical examination without abnormal findings: Secondary | ICD-10-CM | POA: Diagnosis not present

## 2022-05-04 DIAGNOSIS — E7800 Pure hypercholesterolemia, unspecified: Secondary | ICD-10-CM | POA: Diagnosis not present

## 2022-05-04 NOTE — Progress Notes (Unsigned)
Chief Complaint:   OBESITY Jessica Drake is here to discuss her progress with her obesity treatment plan along with follow-up of her obesity related diagnoses. Jessica Drake is on the Category 2 Plan and states she is following her eating plan approximately 75% of the time. Jessica Drake states she is lifting weights for 45-60 minutes 4 times per week.  Today's visit was #: 59 Starting weight: 244 lbs Starting date: 04/22/2020 Today's weight: 217 lbs Today's date: 04/28/2022 Total lbs lost to date: 27 Total lbs lost since last in-office visit: 0  Interim History: Jessica Drake is up 3 pounds since her last visit.  She has felt bloating.  She is taking Saxenda for weight loss.  She is up 2 pounds in water weight.  Subjective:   1. Vitamin D insufficiency Jessica Drake is taking calcium and vitamin D.  2. Essential hypertension Jessica Drake is taking Cozaar, and her blood pressure is controlled.  Assessment/Plan:   1. Vitamin D insufficiency Jessica Drake will continue her OTC supplementations as directed.  2. Essential hypertension Jessica Drake will continue her medications as directed, and she is to eliminate added salt.  3. Obesity with current BMI of 33.0 Jessica Drake is currently in the action stage of change. As such, her goal is to continue with weight loss efforts. She has agreed to the Category 2 Plan.   Jessica Drake will continue Saxenda.  Meal planning was discussed.  She will adhere closely to the plan 80-90%.  She will eat protein and weigh her meat.  Exercise goals: As is, and will use the elliptical.   Behavioral modification strategies: increasing lean protein intake, decreasing simple carbohydrates, increasing vegetables, increasing water intake, decreasing eating out, no skipping meals, meal planning and cooking strategies, keeping healthy foods in the home, and keeping a strict food journal.  Jessica Drake has agreed to follow-up with our clinic in 4 weeks. She was informed of the importance of frequent follow-up visits to  maximize her success with intensive lifestyle modifications for her multiple health conditions.   Objective:   Blood pressure 129/74, pulse 63, temperature 97.6 F (36.4 C), height '5\' 8"'$  (1.727 m), weight 217 lb (98.4 kg), SpO2 97 %. Body mass index is 32.99 kg/m.  General: Cooperative, alert, well developed, in no acute distress. HEENT: Conjunctivae and lids unremarkable. Cardiovascular: Regular rhythm.  Lungs: Normal work of breathing. Neurologic: No focal deficits.   Lab Results  Component Value Date   CREATININE 0.94 03/10/2022   BUN 16 03/10/2022   NA 143 03/10/2022   K 5.0 03/10/2022   CL 104 03/10/2022   CO2 20 03/10/2022   Lab Results  Component Value Date   ALT 19 03/10/2022   AST 22 03/10/2022   ALKPHOS 60 03/10/2022   BILITOT 0.5 03/10/2022   Lab Results  Component Value Date   HGBA1C 5.3 03/10/2022   HGBA1C 5.4 02/04/2021   HGBA1C 5.4 10/10/2020   HGBA1C 5.7 (H) 04/22/2020   Lab Results  Component Value Date   INSULIN 5.7 03/10/2022   INSULIN 6.8 02/04/2021   INSULIN 6.0 10/10/2020   INSULIN 10.7 04/22/2020   No results found for: "TSH" Lab Results  Component Value Date   CHOL 238 (H) 03/10/2022   HDL 111 03/10/2022   LDLCALC 118 (H) 03/10/2022   TRIG 56 03/10/2022   Lab Results  Component Value Date   VD25OH 65.3 03/10/2022   VD25OH 81.9 07/14/2021   VD25OH 71.5 02/04/2021   No results found for: "WBC", "HGB", "HCT", "MCV", "PLT" No results found  for: "IRON", "TIBC", "FERRITIN"  Attestation Statements:   Reviewed by clinician on day of visit: allergies, medications, problem list, medical history, surgical history, family history, social history, and previous encounter notes.   Wilhemena Durie, am acting as Location manager for [employer], DO.  I have reviewed the above documentation for accuracy and completeness, and I agree with the above. Jearld Lesch, DO

## 2022-05-05 ENCOUNTER — Encounter (INDEPENDENT_AMBULATORY_CARE_PROVIDER_SITE_OTHER): Payer: Self-pay | Admitting: Bariatrics

## 2022-05-13 ENCOUNTER — Encounter (INDEPENDENT_AMBULATORY_CARE_PROVIDER_SITE_OTHER): Payer: Self-pay

## 2022-05-16 ENCOUNTER — Other Ambulatory Visit (INDEPENDENT_AMBULATORY_CARE_PROVIDER_SITE_OTHER): Payer: Self-pay | Admitting: Bariatrics

## 2022-05-22 DIAGNOSIS — I8311 Varicose veins of right lower extremity with inflammation: Secondary | ICD-10-CM | POA: Diagnosis not present

## 2022-05-27 ENCOUNTER — Encounter (INDEPENDENT_AMBULATORY_CARE_PROVIDER_SITE_OTHER): Payer: Self-pay | Admitting: Bariatrics

## 2022-05-27 ENCOUNTER — Ambulatory Visit (INDEPENDENT_AMBULATORY_CARE_PROVIDER_SITE_OTHER): Payer: BC Managed Care – PPO | Admitting: Bariatrics

## 2022-05-27 VITALS — BP 104/69 | HR 68 | Temp 97.9°F | Ht 68.0 in | Wt 217.0 lb

## 2022-05-27 DIAGNOSIS — E7800 Pure hypercholesterolemia, unspecified: Secondary | ICD-10-CM

## 2022-05-27 DIAGNOSIS — Z6833 Body mass index (BMI) 33.0-33.9, adult: Secondary | ICD-10-CM

## 2022-05-27 DIAGNOSIS — R7303 Prediabetes: Secondary | ICD-10-CM | POA: Diagnosis not present

## 2022-05-27 DIAGNOSIS — E669 Obesity, unspecified: Secondary | ICD-10-CM | POA: Diagnosis not present

## 2022-05-27 MED ORDER — INSULIN PEN NEEDLE 32G X 4 MM MISC: 0 refills | Status: DC

## 2022-05-27 MED ORDER — SAXENDA 18 MG/3ML ~~LOC~~ SOPN: 3.0000 mg | PEN_INJECTOR | Freq: Every day | SUBCUTANEOUS | 0 refills | Status: DC

## 2022-06-01 NOTE — Progress Notes (Unsigned)
Chief Complaint:   OBESITY Jessica Drake is here to discuss her progress with her obesity treatment plan along with follow-up of her obesity related diagnoses. [name] is on the Category 2 Plan and states she is following her eating plan approximately 40% of the time. [name] states she is lifting weights for 60 minutes 4 times per week.  Today's visit was #: 34 Starting weight: 244 lbs Starting date: 04/22/2020 Today's weight: 217 lbs Today's date: 05/27/22 Total lbs lost to date: 27 Total lbs lost since last in-office visit: 0  Interim History: Her weight remains the same as her previous visit. She is taking Saxenda as directed.  Subjective:   1. Prediabetes Taking Saxenda as directed.  2. Elevated cholesterol No medication.  Assessment/Plan:   1. Prediabetes Refill Saxenda 3 mg into the skin, dispense 15 mL, no refills.  2. Elevated cholesterol 1. No trans fats. 2. Increase MUFAs and PUFAs.  3. Obesity with current BMI of 33.1 1.  Meal planning 2.  Mindful eating 3.  Refill Saxenda and insulin needles. - Insulin Pen Needle 32G X 4 MM MISC; Use daily with Saxenda  Dispense: 100 each; Refill: 0 - Liraglutide -Weight Management (SAXENDA) 18 MG/3ML SOPN; Inject 3 mg into the skin daily.  Dispense: 45 mL; Refill: 0  [name] is currently in the action stage of change. As such, her goal is to continue with weight loss efforts. She has agreed to the Category 2 Plan and following a lower carbohydrate, vegetable and lean protein rich diet plan.  Recommended the book-new Atkins for a New You  Exercise goals: as is.  Behavioral modification strategies: increasing lean protein intake, decreasing simple carbohydrates, increasing vegetables, increasing water intake, decreasing eating out, no skipping meals, meal planning and cooking strategies, keeping healthy foods in the home, and planning for success.  [name] has agreed to follow-up with our clinic in 4 weeks. She was informed of  the importance of frequent follow-up visits to maximize her success with intensive lifestyle modifications for her multiple health conditions.    Objective:   Blood pressure 104/69, pulse 68, temperature 97.9 F (36.6 C), height '5\' 8"'$  (1.727 m), weight 217 lb (98.4 kg), SpO2 97 %. Body mass index is 32.99 kg/m.  General: Cooperative, alert, well developed, in no acute distress. HEENT: Conjunctivae and lids unremarkable. Cardiovascular: Regular rhythm.  Lungs: Normal work of breathing. Neurologic: No focal deficits.   Lab Results  Component Value Date   CREATININE 0.94 03/10/2022   BUN 16 03/10/2022   NA 143 03/10/2022   K 5.0 03/10/2022   CL 104 03/10/2022   CO2 20 03/10/2022   Lab Results  Component Value Date   ALT 19 03/10/2022   AST 22 03/10/2022   ALKPHOS 60 03/10/2022   BILITOT 0.5 03/10/2022   Lab Results  Component Value Date   HGBA1C 5.3 03/10/2022   HGBA1C 5.4 02/04/2021   HGBA1C 5.4 10/10/2020   HGBA1C 5.7 (H) 04/22/2020   Lab Results  Component Value Date   INSULIN 5.7 03/10/2022   INSULIN 6.8 02/04/2021   INSULIN 6.0 10/10/2020   INSULIN 10.7 04/22/2020   No results found for: "TSH" Lab Results  Component Value Date   CHOL 238 (H) 03/10/2022   HDL 111 03/10/2022   LDLCALC 118 (H) 03/10/2022   TRIG 56 03/10/2022   Lab Results  Component Value Date   VD25OH 65.3 03/10/2022   VD25OH 81.9 07/14/2021   VD25OH 71.5 02/04/2021   No results found  for: "WBC", "HGB", "HCT", "MCV", "PLT" No results found for: "IRON", "TIBC", "FERRITIN"   Attestation Statements:   Reviewed by clinician on day of visit: allergies, medications, problem list, medical history, surgical history, family history, social history, and previous encounter notes.  I, Dawn Whitmire, FNP-C, am acting as transcriptionist for Dr. Jearld Lesch.  I have reviewed the above documentation for accuracy and completeness, and I agree with the above. Jearld Lesch, DO

## 2022-06-02 ENCOUNTER — Encounter (INDEPENDENT_AMBULATORY_CARE_PROVIDER_SITE_OTHER): Payer: Self-pay | Admitting: Bariatrics

## 2022-06-24 ENCOUNTER — Ambulatory Visit: Payer: BC Managed Care – PPO | Admitting: Bariatrics

## 2022-06-24 ENCOUNTER — Encounter: Payer: Self-pay | Admitting: Bariatrics

## 2022-06-24 VITALS — BP 120/73 | HR 86 | Temp 97.0°F | Ht 68.0 in | Wt 211.0 lb

## 2022-06-24 DIAGNOSIS — E669 Obesity, unspecified: Secondary | ICD-10-CM | POA: Diagnosis not present

## 2022-06-24 DIAGNOSIS — R7303 Prediabetes: Secondary | ICD-10-CM

## 2022-06-24 DIAGNOSIS — Z6832 Body mass index (BMI) 32.0-32.9, adult: Secondary | ICD-10-CM | POA: Diagnosis not present

## 2022-06-24 DIAGNOSIS — I10 Essential (primary) hypertension: Secondary | ICD-10-CM | POA: Diagnosis not present

## 2022-06-24 MED ORDER — SAXENDA 18 MG/3ML ~~LOC~~ SOPN: 3.0000 mg | PEN_INJECTOR | Freq: Every day | SUBCUTANEOUS | 0 refills | Status: DC

## 2022-06-25 ENCOUNTER — Encounter: Payer: Self-pay | Admitting: Bariatrics

## 2022-06-25 NOTE — Progress Notes (Signed)
Chief Complaint:   OBESITY Jessica Drake is here to discuss her progress with her obesity treatment plan along with follow-up of her obesity related diagnoses. Jessica Drake is on the Category 2 Plan and following a lower carbohydrate, vegetable and lean protein rich diet plan/Atkins and states she is following her eating plan approximately 75% of the time. Jessica Drake states she is lifting weights for 45 minutes 4 times per week and walking for 20 minutes 3 times per week.  Today's visit was #: 17 Starting weight: 244 lbs Starting date: 04/22/2020 Today's weight: 211 lbs Today's date: 06/24/22 Total lbs lost to date: 33 Total lbs lost since last in-office visit: -6  Interim History: She is down another 6 pounds since her last visit.  She started the new Atkins plan.  She denies side effects with Saxenda.  Her appetite is well controlled.  Subjective:   1. Essential hypertension Blood pressure well controlled.  2. Pre-diabetes No medications.  Assessment/Plan:   1. Essential hypertension Continue medications.  2. Pre-diabetes Keep all carbohydrates low (sweets and starches).  3. Obesity with current BMI of 32.1 1.  Intentional eating 2.  Mindful eating 3.  Refill: - Liraglutide -Weight Management (SAXENDA) 18 MG/3ML SOPN; Inject 3 mg into the skin daily.  Dispense: 45 mL; Refill: 0  Jessica Drake is currently in the action stage of change. As such, her goal is to continue with weight loss efforts. She has agreed to the Category 2 Plan.   Exercise goals: Walking (as above).  Behavioral modification strategies: increasing lean protein intake, decreasing simple carbohydrates, increasing vegetables, increasing water intake, decreasing eating out, no skipping meals, meal planning and cooking strategies, and keeping healthy foods in the home.  Jessica Drake has agreed to follow-up with our clinic in 4 weeks. She was informed of the importance of frequent follow-up visits to maximize her success with  intensive lifestyle modifications for her multiple health conditions.    Objective:   Blood pressure 120/73, pulse 86, temperature (!) 97 F (36.1 C), height '5\' 8"'$  (1.727 m), weight 211 lb (95.7 kg), SpO2 98 %. Body mass index is 32.08 kg/m.  General: Cooperative, alert, well developed, in no acute distress. HEENT: Conjunctivae and lids unremarkable. Cardiovascular: Regular rhythm.  Lungs: Normal work of breathing. Neurologic: No focal deficits.   Lab Results  Component Value Date   CREATININE 0.94 03/10/2022   BUN 16 03/10/2022   NA 143 03/10/2022   K 5.0 03/10/2022   CL 104 03/10/2022   CO2 20 03/10/2022   Lab Results  Component Value Date   ALT 19 03/10/2022   AST 22 03/10/2022   ALKPHOS 60 03/10/2022   BILITOT 0.5 03/10/2022   Lab Results  Component Value Date   HGBA1C 5.3 03/10/2022   HGBA1C 5.4 02/04/2021   HGBA1C 5.4 10/10/2020   HGBA1C 5.7 (H) 04/22/2020   Lab Results  Component Value Date   INSULIN 5.7 03/10/2022   INSULIN 6.8 02/04/2021   INSULIN 6.0 10/10/2020   INSULIN 10.7 04/22/2020   No results found for: "TSH" Lab Results  Component Value Date   CHOL 238 (H) 03/10/2022   HDL 111 03/10/2022   LDLCALC 118 (H) 03/10/2022   TRIG 56 03/10/2022   Lab Results  Component Value Date   VD25OH 65.3 03/10/2022   VD25OH 81.9 07/14/2021   VD25OH 71.5 02/04/2021   No results found for: "WBC", "HGB", "HCT", "MCV", "PLT" No results found for: "IRON", "TIBC", "FERRITIN"   Attestation Statements:   Reviewed  by clinician on day of visit: allergies, medications, problem list, medical history, surgical history, family history, social history, and previous encounter notes.  I, Dawn Whitmire, FNP-C, am acting as transcriptionist for Dr. Jearld Lesch.  I have reviewed the above documentation for accuracy and completeness, and I agree with the above. Jearld Lesch, DO

## 2022-07-09 DIAGNOSIS — M7731 Calcaneal spur, right foot: Secondary | ICD-10-CM | POA: Diagnosis not present

## 2022-07-09 DIAGNOSIS — M24571 Contracture, right ankle: Secondary | ICD-10-CM | POA: Diagnosis not present

## 2022-07-09 DIAGNOSIS — M722 Plantar fascial fibromatosis: Secondary | ICD-10-CM | POA: Diagnosis not present

## 2022-07-13 DIAGNOSIS — Z23 Encounter for immunization: Secondary | ICD-10-CM | POA: Diagnosis not present

## 2022-07-22 ENCOUNTER — Ambulatory Visit: Payer: BC Managed Care – PPO | Admitting: Bariatrics

## 2022-08-10 DIAGNOSIS — M722 Plantar fascial fibromatosis: Secondary | ICD-10-CM | POA: Diagnosis not present

## 2022-08-12 ENCOUNTER — Encounter: Payer: Self-pay | Admitting: Bariatrics

## 2022-08-12 ENCOUNTER — Ambulatory Visit: Payer: BC Managed Care – PPO | Admitting: Bariatrics

## 2022-08-12 ENCOUNTER — Other Ambulatory Visit (HOSPITAL_COMMUNITY): Payer: Self-pay

## 2022-08-12 VITALS — BP 115/72 | HR 63 | Temp 97.6°F | Ht 68.0 in | Wt 212.0 lb

## 2022-08-12 DIAGNOSIS — R632 Polyphagia: Secondary | ICD-10-CM | POA: Diagnosis not present

## 2022-08-12 DIAGNOSIS — Z6832 Body mass index (BMI) 32.0-32.9, adult: Secondary | ICD-10-CM

## 2022-08-12 DIAGNOSIS — E669 Obesity, unspecified: Secondary | ICD-10-CM

## 2022-08-12 DIAGNOSIS — R7303 Prediabetes: Secondary | ICD-10-CM | POA: Diagnosis not present

## 2022-08-12 MED ORDER — SAXENDA 18 MG/3ML ~~LOC~~ SOPN
3.0000 mg | PEN_INJECTOR | Freq: Every day | SUBCUTANEOUS | 0 refills | Status: DC
  Filled 2022-08-12 – 2022-09-03 (×2): qty 15, 30d supply, fill #0

## 2022-08-24 ENCOUNTER — Encounter: Payer: Self-pay | Admitting: Bariatrics

## 2022-08-24 NOTE — Progress Notes (Signed)
Chief Complaint:   OBESITY Jessica Drake is here to discuss her progress with her obesity treatment plan along with follow-up of her obesity related diagnoses. Jessica Drake is on the Category 2 Plan and states she is following her eating plan approximately 70% of the time. Jessica Drake states she is doing 0 minutes 0 times per week.  Today's visit was #: 5 Starting weight: 244 lbs Starting date: 04/22/2020 Today's weight: 212 lbs Today's date: 08/12/2022 Total lbs lost to date: 32 Total lbs lost since last in-office visit: 0  Interim History: Jessica Drake is up 1 lb since her last visit, but she has done well overall. Her exercise has decreased due to plantar fascitis, and cast on her right foot.   Subjective:   1. Polyphagia Jessica Drake notes Jessica Drake helps with her appetite.   2. Pre-diabetes Jessica Drake is not on specific medications.   Assessment/Plan:   1. Polyphagia Jessica Drake will continue Saxenda, and we will refill for 1 month.   - Liraglutide -Weight Management (SAXENDA) 18 MG/3ML SOPN; Inject 3 mg into the skin daily.  Dispense: 45 mL; Refill: 0  2. Pre-diabetes Jessica Drake will continue Saxenda. She will minimize all carbohydrates (sugar and starches).   - Liraglutide -Weight Management (SAXENDA) 18 MG/3ML SOPN; Inject 3 mg into the skin daily.  Dispense: 45 mL; Refill: 0  3. Obesity with current BMI of 32.3 Jaslin is currently in the action stage of change. As such, her goal is to continue with weight loss efforts. She has agreed to the Category 2 Plan.   Jessica Drake will continue Saxenda. She will continue to adhere to the plan closely 85-95%. Eat better snacks.   - Liraglutide -Weight Management (SAXENDA) 18 MG/3ML SOPN; Inject 3 mg into the skin daily.  Dispense: 45 mL; Refill: 0  Exercise goals: No exercise has been prescribed at this time.  Behavioral modification strategies: increasing lean protein intake, decreasing simple carbohydrates, increasing vegetables, increasing water intake, decreasing  eating out, no skipping meals, meal planning and cooking strategies, and keeping healthy foods in the home.  Jessica Drake has agreed to follow-up with our clinic in 4 weeks. She was informed of the importance of frequent follow-up visits to maximize her success with intensive lifestyle modifications for her multiple health conditions.   Objective:   Blood pressure 115/72, pulse 63, temperature 97.6 F (36.4 C), height '5\' 8"'$  (1.727 m), weight 212 lb (96.2 kg), SpO2 99 %. Body mass index is 32.23 kg/m.  General: Cooperative, alert, well developed, in no acute distress. HEENT: Conjunctivae and lids unremarkable. Cardiovascular: Regular rhythm.  Lungs: Normal work of breathing. Neurologic: No focal deficits.   Lab Results  Component Value Date   CREATININE 0.94 03/10/2022   BUN 16 03/10/2022   NA 143 03/10/2022   K 5.0 03/10/2022   CL 104 03/10/2022   CO2 20 03/10/2022   Lab Results  Component Value Date   ALT 19 03/10/2022   AST 22 03/10/2022   ALKPHOS 60 03/10/2022   BILITOT 0.5 03/10/2022   Lab Results  Component Value Date   HGBA1C 5.3 03/10/2022   HGBA1C 5.4 02/04/2021   HGBA1C 5.4 10/10/2020   HGBA1C 5.7 (H) 04/22/2020   Lab Results  Component Value Date   INSULIN 5.7 03/10/2022   INSULIN 6.8 02/04/2021   INSULIN 6.0 10/10/2020   INSULIN 10.7 04/22/2020   No results found for: "TSH" Lab Results  Component Value Date   CHOL 238 (H) 03/10/2022   HDL 111 03/10/2022   LDLCALC 118 (  H) 03/10/2022   TRIG 56 03/10/2022   Lab Results  Component Value Date   VD25OH 65.3 03/10/2022   VD25OH 81.9 07/14/2021   VD25OH 71.5 02/04/2021   No results found for: "WBC", "HGB", "HCT", "MCV", "PLT" No results found for: "IRON", "TIBC", "FERRITIN"  Attestation Statements:   Reviewed by clinician on day of visit: allergies, medications, problem list, medical history, surgical history, family history, social history, and previous encounter notes.   Wilhemena Durie, am acting  as Location manager for [employer], DO.  I have reviewed the above documentation for accuracy and completeness, and I agree with the above. Jearld Lesch, DO

## 2022-08-25 DIAGNOSIS — E042 Nontoxic multinodular goiter: Secondary | ICD-10-CM | POA: Diagnosis not present

## 2022-08-25 DIAGNOSIS — Z6834 Body mass index (BMI) 34.0-34.9, adult: Secondary | ICD-10-CM | POA: Diagnosis not present

## 2022-08-25 DIAGNOSIS — M722 Plantar fascial fibromatosis: Secondary | ICD-10-CM | POA: Diagnosis not present

## 2022-08-25 DIAGNOSIS — E559 Vitamin D deficiency, unspecified: Secondary | ICD-10-CM | POA: Diagnosis not present

## 2022-09-03 ENCOUNTER — Other Ambulatory Visit (HOSPITAL_COMMUNITY): Payer: Self-pay

## 2022-09-08 DIAGNOSIS — M722 Plantar fascial fibromatosis: Secondary | ICD-10-CM | POA: Diagnosis not present

## 2022-09-09 ENCOUNTER — Ambulatory Visit: Payer: BC Managed Care – PPO | Admitting: Bariatrics

## 2022-09-15 ENCOUNTER — Ambulatory Visit: Payer: BC Managed Care – PPO | Admitting: Bariatrics

## 2022-09-15 ENCOUNTER — Encounter: Payer: Self-pay | Admitting: Bariatrics

## 2022-09-15 VITALS — BP 102/65 | HR 63 | Temp 97.7°F | Ht 68.0 in | Wt 214.0 lb

## 2022-09-15 DIAGNOSIS — Z6832 Body mass index (BMI) 32.0-32.9, adult: Secondary | ICD-10-CM | POA: Diagnosis not present

## 2022-09-15 DIAGNOSIS — R7303 Prediabetes: Secondary | ICD-10-CM

## 2022-09-15 DIAGNOSIS — E669 Obesity, unspecified: Secondary | ICD-10-CM

## 2022-09-15 DIAGNOSIS — R632 Polyphagia: Secondary | ICD-10-CM | POA: Diagnosis not present

## 2022-09-15 MED ORDER — INSULIN PEN NEEDLE 32G X 4 MM MISC: 0 refills | Status: DC

## 2022-09-20 ENCOUNTER — Other Ambulatory Visit: Payer: Self-pay | Admitting: Bariatrics

## 2022-09-20 DIAGNOSIS — R632 Polyphagia: Secondary | ICD-10-CM

## 2022-09-20 DIAGNOSIS — R7303 Prediabetes: Secondary | ICD-10-CM

## 2022-09-22 ENCOUNTER — Other Ambulatory Visit: Payer: Self-pay | Admitting: Bariatrics

## 2022-09-22 ENCOUNTER — Other Ambulatory Visit (HOSPITAL_COMMUNITY): Payer: Self-pay

## 2022-09-22 ENCOUNTER — Other Ambulatory Visit: Payer: Self-pay | Admitting: Internal Medicine

## 2022-09-22 DIAGNOSIS — R632 Polyphagia: Secondary | ICD-10-CM

## 2022-09-22 DIAGNOSIS — R7303 Prediabetes: Secondary | ICD-10-CM

## 2022-09-22 DIAGNOSIS — E042 Nontoxic multinodular goiter: Secondary | ICD-10-CM

## 2022-09-23 DIAGNOSIS — M722 Plantar fascial fibromatosis: Secondary | ICD-10-CM | POA: Diagnosis not present

## 2022-09-24 ENCOUNTER — Other Ambulatory Visit (HOSPITAL_COMMUNITY): Payer: Self-pay

## 2022-09-29 ENCOUNTER — Encounter: Payer: Self-pay | Admitting: Bariatrics

## 2022-09-29 NOTE — Progress Notes (Signed)
Chief Complaint:   OBESITY Jessica Drake is here to discuss her progress with her obesity treatment plan along with follow-up of her obesity related diagnoses. Jessica Drake is on the Category 2 Plan and states she is following her eating plan approximately 20% of the time. Jessica Drake states she is doing 0 minutes 0 times per week.  Today's visit was #: 53 Starting weight: 244 lbs Starting date: 04/22/2020 Today's weight: 214 lbs Today's date: 09/15/2022 Total lbs lost to date: 30 Total lbs lost since last in-office visit: 0  Interim History: [place]iara is up 2 lbs since her last visit. She has plantar fascitis, and she is unable to exercise.  She had cortisone injection with Ortho.  Subjective:   1. Pre-diabetes Jessica Drake is taking Saxenda with no side effects.  2. Polyphagia Jessica Drake is taking Saxenda with no side effects.  Assessment/Plan:   1. Pre-diabetes Jessica Drake will continue Saxenda, and she will work on increasing her exercise.  - Insulin Pen Needle 32G X 4 MM MISC; Use daily with Saxenda  Dispense: 100 each; Refill: 0  2. Polyphagia We will refill insulin pen needles #100.  Jessica Drake will keep her water and protein intake high.  She will work on restarting exercise.  - Insulin Pen Needle 32G X 4 MM MISC; Use daily with Saxenda  Dispense: 100 each; Refill: 0  3. Obesity with current BMI of 32.6 Jessica Drake is currently in the action stage of change. As such, her goal is to continue with weight loss efforts. She has agreed to the Category 1 Plan.   Mindful eating was discussed.  She will adhere more closely to the plan.  Changed to category 1.  Protein shake handout was given.  Exercise goals: No exercise has been prescribed at this time.  Behavioral modification strategies: increasing lean protein intake, decreasing simple carbohydrates, increasing vegetables, increasing water intake, decreasing eating out, no skipping meals, meal planning and cooking strategies, keeping healthy foods in the  home, and planning for success.  Jessica Drake has agreed to follow-up with our clinic in 4 weeks. She was informed of the importance of frequent follow-up visits to maximize her success with intensive lifestyle modifications for her multiple health conditions.   Objective:   Blood pressure 102/65, pulse 63, temperature 97.7 F (36.5 C), height '5\' 8"'$  (1.727 m), weight 214 lb (97.1 kg), SpO2 98 %. Body mass index is 32.54 kg/m.  General: Cooperative, alert, well developed, in no acute distress. HEENT: Conjunctivae and lids unremarkable. Cardiovascular: Regular rhythm.  Lungs: Normal work of breathing. Neurologic: No focal deficits.   Lab Results  Component Value Date   CREATININE 0.94 03/10/2022   BUN 16 03/10/2022   NA 143 03/10/2022   K 5.0 03/10/2022   CL 104 03/10/2022   CO2 20 03/10/2022   Lab Results  Component Value Date   ALT 19 03/10/2022   AST 22 03/10/2022   ALKPHOS 60 03/10/2022   BILITOT 0.5 03/10/2022   Lab Results  Component Value Date   HGBA1C 5.3 03/10/2022   HGBA1C 5.4 02/04/2021   HGBA1C 5.4 10/10/2020   HGBA1C 5.7 (H) 04/22/2020   Lab Results  Component Value Date   INSULIN 5.7 03/10/2022   INSULIN 6.8 02/04/2021   INSULIN 6.0 10/10/2020   INSULIN 10.7 04/22/2020   No results found for: "TSH" Lab Results  Component Value Date   CHOL 238 (H) 03/10/2022   HDL 111 03/10/2022   LDLCALC 118 (H) 03/10/2022   TRIG 56 03/10/2022  Lab Results  Component Value Date   VD25OH 65.3 03/10/2022   VD25OH 81.9 07/14/2021   VD25OH 71.5 02/04/2021   No results found for: "WBC", "HGB", "HCT", "MCV", "PLT" No results found for: "IRON", "TIBC", "FERRITIN"  Attestation Statements:   Reviewed by clinician on day of visit: allergies, medications, problem list, medical history, surgical history, family history, social history, and previous encounter notes.   Wilhemena Durie, am acting as Location manager for [employer], DO.  I have reviewed the above  documentation for accuracy and completeness, and I agree with the above. Jearld Lesch, DO

## 2022-10-07 ENCOUNTER — Ambulatory Visit
Admission: RE | Admit: 2022-10-07 | Discharge: 2022-10-07 | Disposition: A | Discharge status: Home / Self Care | Payer: BC Managed Care – PPO | Source: Ambulatory Visit | Attending: Internal Medicine | Admitting: Internal Medicine

## 2022-10-07 DIAGNOSIS — E041 Nontoxic single thyroid nodule: Secondary | ICD-10-CM | POA: Diagnosis not present

## 2022-10-07 DIAGNOSIS — E042 Nontoxic multinodular goiter: Secondary | ICD-10-CM

## 2022-10-07 DIAGNOSIS — M722 Plantar fascial fibromatosis: Secondary | ICD-10-CM | POA: Diagnosis not present

## 2022-10-13 ENCOUNTER — Encounter: Payer: Self-pay | Admitting: Bariatrics

## 2022-10-13 ENCOUNTER — Other Ambulatory Visit (HOSPITAL_COMMUNITY): Payer: Self-pay

## 2022-10-13 ENCOUNTER — Ambulatory Visit: Payer: BC Managed Care – PPO | Admitting: Bariatrics

## 2022-10-13 DIAGNOSIS — E669 Obesity, unspecified: Secondary | ICD-10-CM

## 2022-10-13 DIAGNOSIS — R632 Polyphagia: Secondary | ICD-10-CM | POA: Diagnosis not present

## 2022-10-13 DIAGNOSIS — Z6832 Body mass index (BMI) 32.0-32.9, adult: Secondary | ICD-10-CM

## 2022-10-13 DIAGNOSIS — R7303 Prediabetes: Secondary | ICD-10-CM

## 2022-10-13 MED ORDER — ZEPBOUND 7.5 MG/0.5ML ~~LOC~~ SOAJ
7.5000 mg | SUBCUTANEOUS | 0 refills | Status: DC
  Filled 2022-10-13: qty 2, 28d supply, fill #0

## 2022-10-15 ENCOUNTER — Other Ambulatory Visit: Payer: Self-pay | Admitting: Internal Medicine

## 2022-10-15 DIAGNOSIS — E041 Nontoxic single thyroid nodule: Secondary | ICD-10-CM

## 2022-10-19 ENCOUNTER — Other Ambulatory Visit (HOSPITAL_COMMUNITY): Payer: Self-pay

## 2022-10-24 NOTE — Progress Notes (Signed)
Chief Complaint:   OBESITY Jessica Drake is here to discuss her progress with her obesity treatment plan along with follow-up of her obesity related diagnoses. Jessica Drake is on the Category 1 Plan and states she is following her eating plan approximately 40% of the time. Jessica Drake states she is not currently exercising.  Today's visit was #: 52 Starting weight: 244 lbs Starting date: 04/22/2020 Today's weight: 216 lbs Today's date: 10/13/2022 Total lbs lost to date: 28 Total lbs lost since last in-office visit: +2  Interim History: Jessica Drake is up 2 lbs since her last visit over the holidays. She had more carbohydrates.  Subjective:   1. Pre-diabetes Jessica Drake is taking [place].  2. Jessica Drake notes Jessica Drake helps with her appetite.  Assessment/Plan:   1. Pre-diabetes Discontinue Saxenda and start Zepbound once weekly. Minimize all carbohydrates, to include sugars and starches.  Start- tirzepatide (ZEPBOUND) 7.5 MG/0.5ML Pen; Inject 7.5 mg into the skin once a week.  Dispense: 2 mL; Refill: 0  2. Polyphagia Discontinue Saxenda and start Zepbound once weekly. Minimize all carbohydrates, to include sugars and starches.  3. Obesity with current BMI of 32.9 Jessica Drake is currently in the action stage of change. As such, her goal is to continue with weight loss efforts. She has agreed to the Category 1 Plan.   Meal planning Intentional eating Jessica Drake will get back in the gym. Will stay with healthy carbs and protein. Increase fiber.  Exercise goals:  Start going back to the gym.  Behavioral modification strategies: increasing lean protein intake, decreasing simple carbohydrates, increasing vegetables, increasing water intake, decreasing eating out, no skipping meals, meal planning and cooking strategies, keeping healthy foods in the home, and planning for success.  Jessica Drake has agreed to follow-up with our clinic in 4 weeks. She was informed of the importance of frequent follow-up visits to  maximize her success with intensive lifestyle modifications for her multiple health conditions.   Objective:   Blood pressure 122/74, pulse 69, temperature 97.9 F (36.6 C), height '5\' 8"'$  (1.727 m), weight 216 lb (98 kg), SpO2 99 %. Body mass index is 32.84 kg/m.  General: Cooperative, alert, well developed, in no acute distress. HEENT: Conjunctivae and lids unremarkable. Cardiovascular: Regular rhythm.  Lungs: Normal work of breathing. Neurologic: No focal deficits.   Lab Results  Component Value Date   CREATININE 0.94 03/10/2022   BUN 16 03/10/2022   NA 143 03/10/2022   K 5.0 03/10/2022   CL 104 03/10/2022   CO2 20 03/10/2022   Lab Results  Component Value Date   ALT 19 03/10/2022   AST 22 03/10/2022   ALKPHOS 60 03/10/2022   BILITOT 0.5 03/10/2022   Lab Results  Component Value Date   HGBA1C 5.3 03/10/2022   HGBA1C 5.4 02/04/2021   HGBA1C 5.4 10/10/2020   HGBA1C 5.7 (H) 04/22/2020   Lab Results  Component Value Date   INSULIN 5.7 03/10/2022   INSULIN 6.8 02/04/2021   INSULIN 6.0 10/10/2020   INSULIN 10.7 04/22/2020   No results found for: "TSH" Lab Results  Component Value Date   CHOL 238 (H) 03/10/2022   HDL 111 03/10/2022   LDLCALC 118 (H) 03/10/2022   TRIG 56 03/10/2022   Lab Results  Component Value Date   VD25OH 65.3 03/10/2022   VD25OH 81.9 07/14/2021   VD25OH 71.5 02/04/2021    Attestation Statements:   Reviewed by clinician on day of visit: allergies, medications, problem list, medical history, surgical history, family history, social history, and  previous encounter notes.  I, Kathlene November, BS, CMA, am acting as transcriptionist for [employer], DO.  I have reviewed the above documentation for accuracy and completeness, and I agree with the above. Jearld Lesch, DO

## 2022-10-26 ENCOUNTER — Other Ambulatory Visit: Payer: Self-pay

## 2022-10-26 ENCOUNTER — Encounter: Payer: Self-pay | Admitting: Bariatrics

## 2022-10-26 ENCOUNTER — Other Ambulatory Visit (HOSPITAL_COMMUNITY): Payer: Self-pay

## 2022-10-27 ENCOUNTER — Encounter: Payer: Self-pay | Admitting: Bariatrics

## 2022-10-29 ENCOUNTER — Other Ambulatory Visit (HOSPITAL_COMMUNITY): Payer: Self-pay

## 2022-10-29 ENCOUNTER — Other Ambulatory Visit: Payer: Self-pay | Admitting: Bariatrics

## 2022-10-29 MED ORDER — SAXENDA 18 MG/3ML ~~LOC~~ SOPN: 3.0000 mg | PEN_INJECTOR | Freq: Every day | SUBCUTANEOUS | 0 refills | Status: DC

## 2022-11-11 ENCOUNTER — Ambulatory Visit: Payer: BC Managed Care – PPO | Admitting: Bariatrics

## 2022-11-16 IMAGING — US US FNA BIOPSY THYROID 1ST LESION
1 series · 13 of 13 positions shown · non-contrast
Comparison: Ultrasound thyroid 09/01/2021

MEDICATIONS:
None

COMPLICATIONS:
None immediate.

INDICATION: Indeterminate thyroid nodule

EXAM:
ULTRASOUND GUIDED FINE NEEDLE ASPIRATION OF INDETERMINATE THYROID
NODULE
TECHNIQUE: Informed written consent was obtained from the patient after a
discussion of the risks, benefits and alternatives to treatment.
Questions regarding the procedure were encouraged and answered. A
timeout was performed prior to the initiation of the procedure.

[Series 1: us fna biopsy thyroid 1st lesion · 0.09mm/px · 13 acquisitions, 13 frames shown]
[im 1/13]
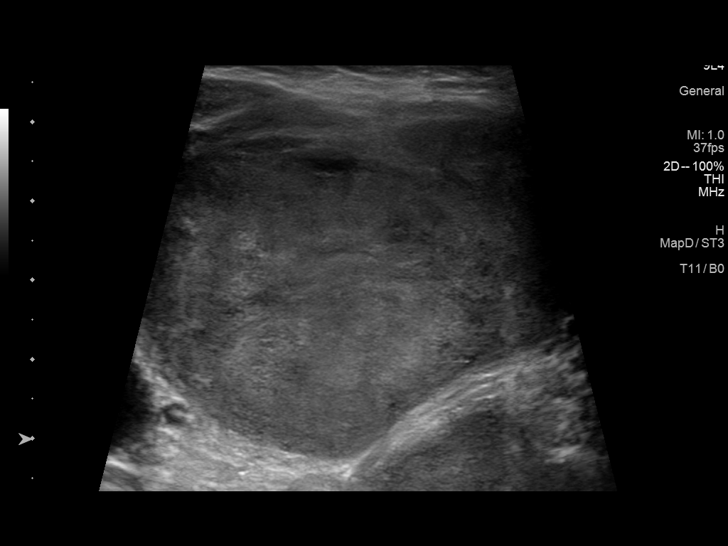
[im 2/13]
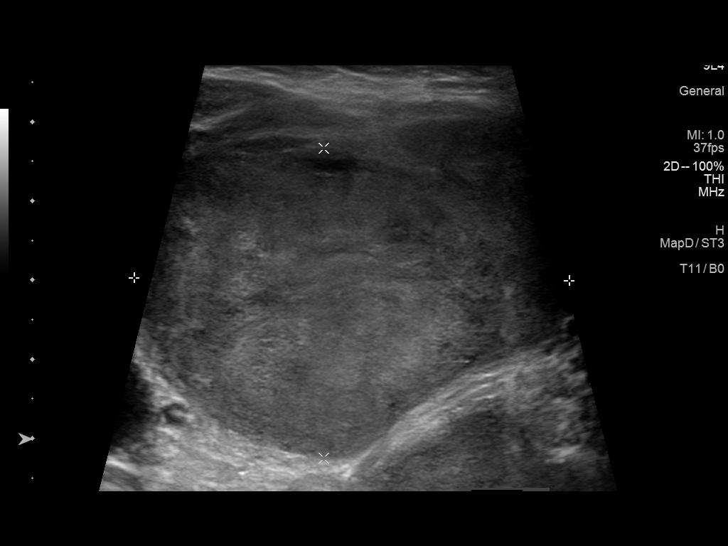
[im 3/13]
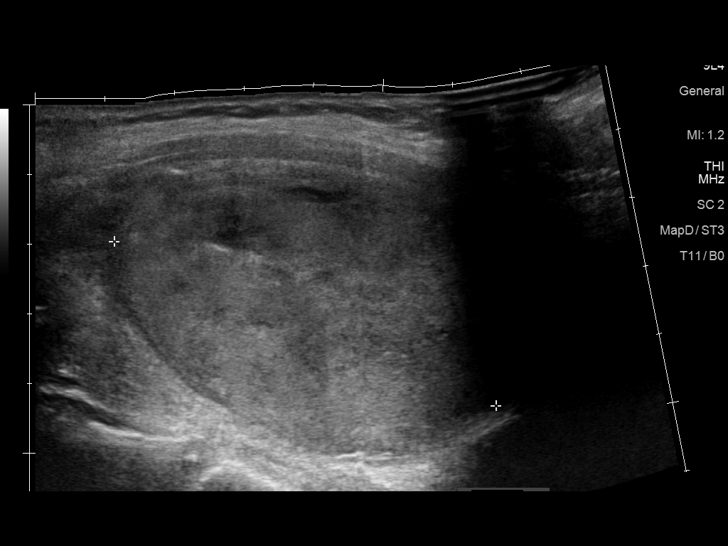
[im 4/13]
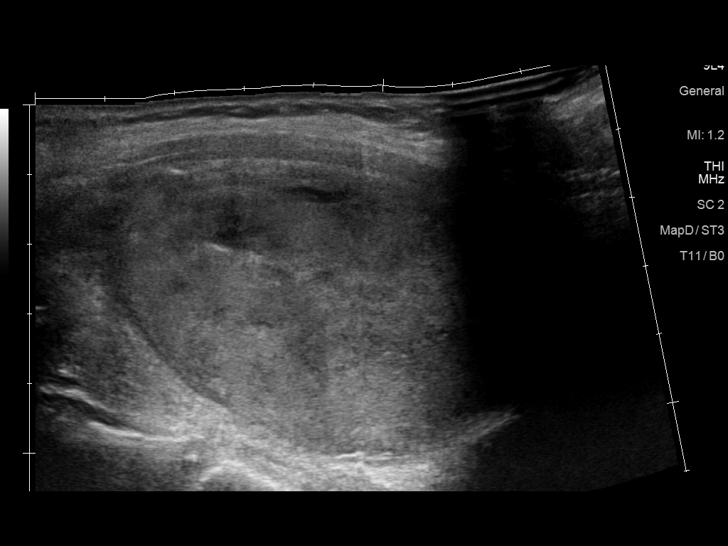
[im 5/13]
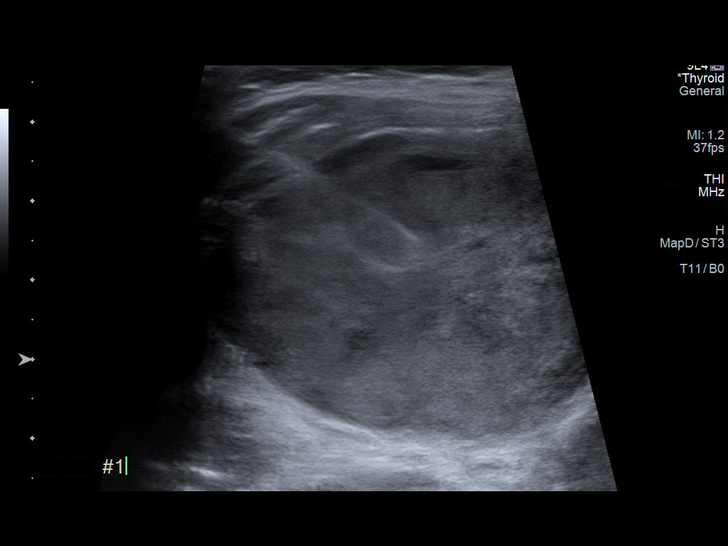
[im 6/13]
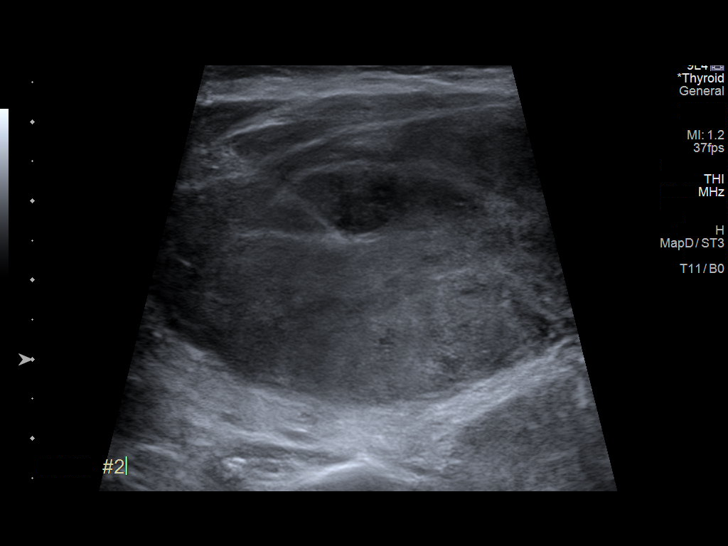
[im 7/13]
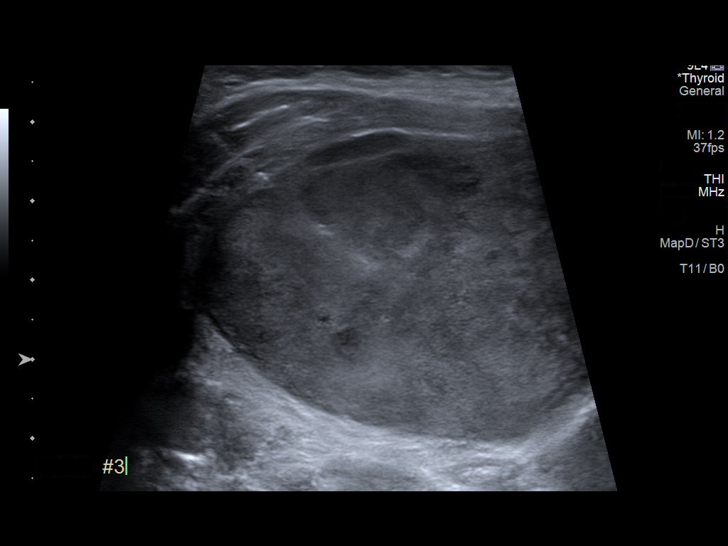
[im 8/13]
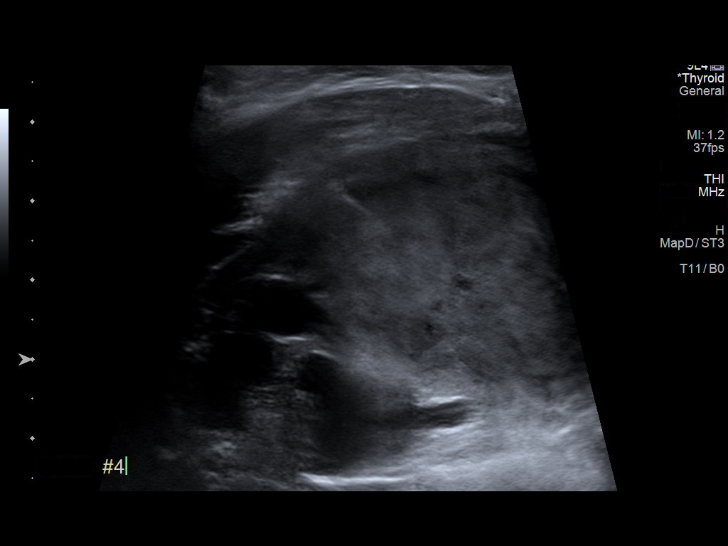
[im 9/13]
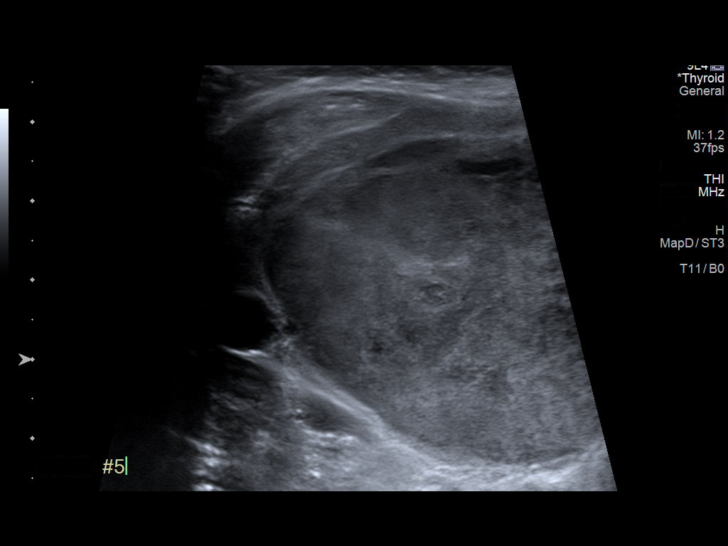
[im 10/13]
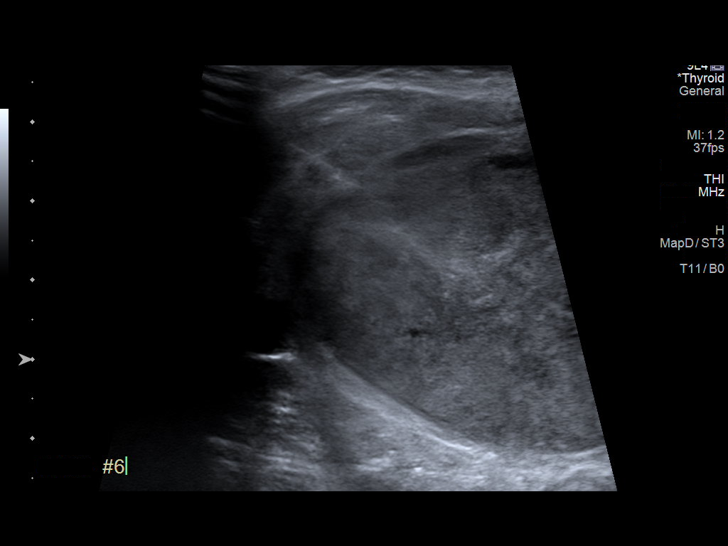
[im 11/13]
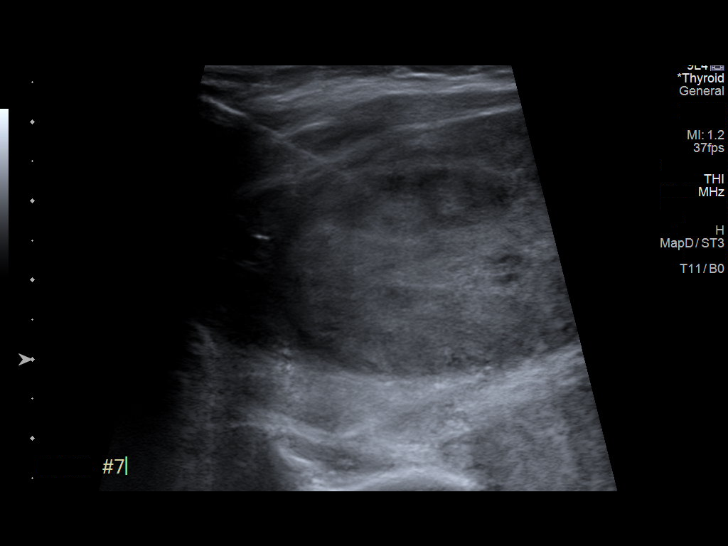
[im 12/13]
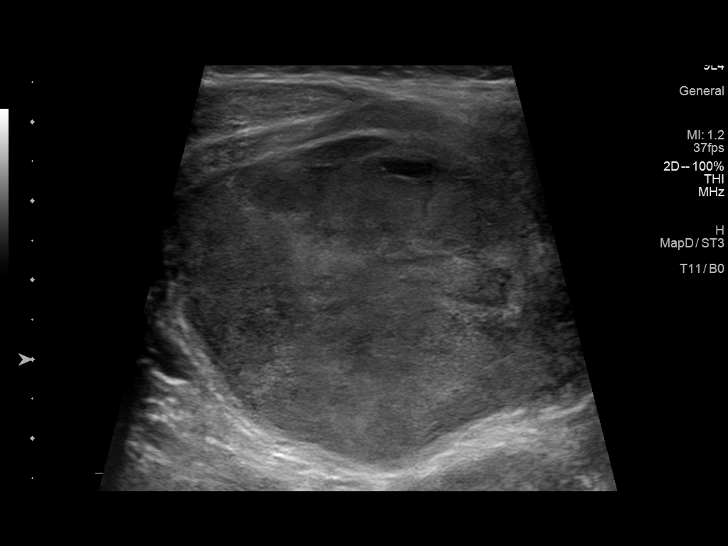
[im 13/13]
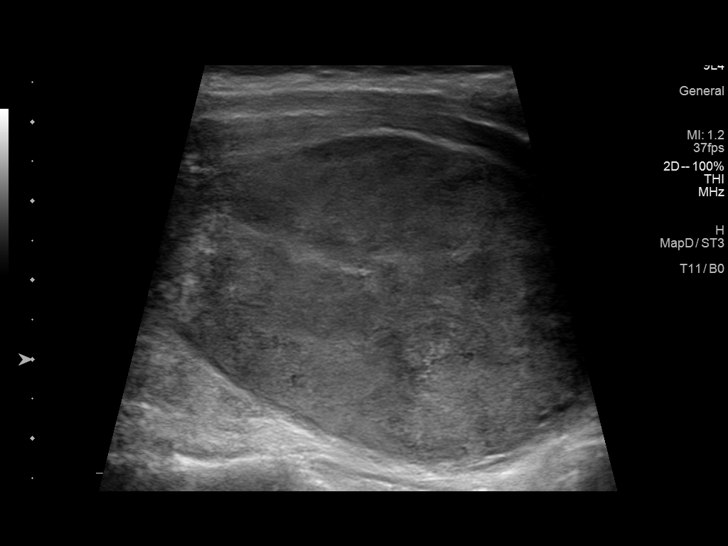

[13 of 13 positions shown; findings below may reference images not displayed]

Pre-procedural ultrasound scanning demonstrated unchanged size and
appearance of the indeterminate nodule within the inferior right
thyroid lobe.

The procedure was planned. The neck was prepped in the usual sterile
fashion, and a sterile drape was applied covering the operative
field. A timeout was performed prior to the initiation of the
procedure. Local anesthesia was provided with 1% lidocaine.

Under direct ultrasound guidance, 6 FNA biopsies were performed of
the right inferior thyroid nodule with a 25 gauge needle. Multiple
ultrasound images were saved for procedural documentation purposes.
The samples were prepared and submitted to pathology.

Limited post procedural scanning was negative for hematoma or
additional complication. Dressings were placed. The patient
tolerated the above procedures procedure well without immediate
postprocedural complication.
FINDINGS: Nodule reference number based on prior diagnostic ultrasound: 4

Maximum size: 5.9 cm

Location: Right; inferior

ACR TI-RADS risk category: TR3 (3 points)

Reason for biopsy: meets ACR TI-RADS criteria

Ultrasound imaging confirms appropriate placement of the needles
within the thyroid nodule.
IMPRESSION: Technically successful ultrasound guided fine needle aspiration of
TI-RADS 3 right inferior thyroid nodule (#4).

## 2022-11-17 ENCOUNTER — Other Ambulatory Visit (HOSPITAL_COMMUNITY)
Admission: RE | Admit: 2022-11-17 | Discharge: 2022-11-17 | Disposition: A | Discharge status: Home / Self Care | Payer: BC Managed Care – PPO | Source: Ambulatory Visit | Attending: Internal Medicine | Admitting: Internal Medicine

## 2022-11-17 ENCOUNTER — Ambulatory Visit
Admission: RE | Admit: 2022-11-17 | Discharge: 2022-11-17 | Disposition: A | Discharge status: Home / Self Care | Payer: BC Managed Care – PPO | Source: Ambulatory Visit | Attending: Internal Medicine | Admitting: Internal Medicine

## 2022-11-17 DIAGNOSIS — E041 Nontoxic single thyroid nodule: Secondary | ICD-10-CM | POA: Diagnosis not present

## 2022-11-18 DIAGNOSIS — M722 Plantar fascial fibromatosis: Secondary | ICD-10-CM | POA: Diagnosis not present

## 2022-11-19 LAB — CYTOLOGY - NON PAP

## 2022-11-24 ENCOUNTER — Ambulatory Visit: Payer: BC Managed Care – PPO | Admitting: Bariatrics

## 2022-11-24 ENCOUNTER — Encounter: Payer: Self-pay | Admitting: Bariatrics

## 2022-11-24 VITALS — BP 123/75 | HR 69 | Temp 97.8°F | Ht 68.0 in | Wt 200.0 lb

## 2022-11-24 DIAGNOSIS — I10 Essential (primary) hypertension: Secondary | ICD-10-CM | POA: Diagnosis not present

## 2022-11-24 DIAGNOSIS — E669 Obesity, unspecified: Secondary | ICD-10-CM | POA: Diagnosis not present

## 2022-11-24 DIAGNOSIS — R632 Polyphagia: Secondary | ICD-10-CM

## 2022-11-24 DIAGNOSIS — Z6833 Body mass index (BMI) 33.0-33.9, adult: Secondary | ICD-10-CM

## 2022-11-24 DIAGNOSIS — R7303 Prediabetes: Secondary | ICD-10-CM

## 2022-11-24 MED ORDER — WEGOVY 0.5 MG/0.5ML ~~LOC~~ SOAJ: 0.5000 mg | SUBCUTANEOUS | 0 refills | Status: DC

## 2022-11-26 ENCOUNTER — Encounter: Payer: Self-pay | Admitting: Bariatrics

## 2022-11-26 ENCOUNTER — Other Ambulatory Visit (INDEPENDENT_AMBULATORY_CARE_PROVIDER_SITE_OTHER): Payer: Self-pay | Admitting: Bariatrics

## 2022-11-26 ENCOUNTER — Other Ambulatory Visit (HOSPITAL_COMMUNITY): Payer: Self-pay

## 2022-11-26 DIAGNOSIS — R7303 Prediabetes: Secondary | ICD-10-CM

## 2022-11-26 DIAGNOSIS — R632 Polyphagia: Secondary | ICD-10-CM

## 2022-11-26 DIAGNOSIS — I10 Essential (primary) hypertension: Secondary | ICD-10-CM

## 2022-11-26 MED ORDER — WEGOVY 0.5 MG/0.5ML ~~LOC~~ SOAJ
0.5000 mg | SUBCUTANEOUS | 0 refills | Status: DC
  Filled 2022-11-26: qty 2, 28d supply, fill #0

## 2022-11-27 ENCOUNTER — Other Ambulatory Visit (HOSPITAL_COMMUNITY): Payer: Self-pay

## 2022-12-06 NOTE — Progress Notes (Unsigned)
Chief Complaint:   OBESITY Jessica Drake is here to discuss her progress with her obesity treatment plan along with follow-up of her obesity related diagnoses. Jessica Drake is on the Category 1 Plan and states she is following her eating plan approximately 1% of the time. Jessica Drake states she is lifting weights 30 minutes 2 times per week.  Today's visit was #: 76 Starting weight: 244 lbs Starting date: 04/22/2020 Today's weight: 220 lbs Today's date: 11/24/2022 Total lbs lost to date: 24 Total lbs lost since last in-office visit: +4  Interim History: Jessica Drake is up 4 lbs since her last visit. She has been off GLP-1.  Subjective:   1. Prediabetes Jessica Drake has been on Saxenda for some time (had lost weight) but stopped with plateau and Zepbound was denied.  2. Essential hypertension BP 123/75.  3. Polyphagia Symptoms increased after taking Saxenda.  Assessment/Plan:   1. Prediabetes Start Wegovy 0.5 mg weekly. Minimize all carbohydrates.  2. Essential hypertension Continue medications. No added salt.  3. Polyphagia Will decrease carbohydrates (sweets and starches). Increase water Start Wegovy 0.5 mg weekly.  4. Generalized obesity 5. BMI 33.0-33.9,adult Will resume a GLP-1 Will stop all sweets/any fruit or yogurt.  Jessica Drake is currently in the action stage of change. As such, her goal is to continue with weight loss efforts. She has agreed to the Category 1 Plan.   Exercise goals:  Get back in the gym.  Behavioral modification strategies: increasing lean protein intake, decreasing simple carbohydrates, increasing vegetables, increasing water intake, decreasing eating out, no skipping meals, meal planning and cooking strategies, keeping healthy foods in the home, and planning for success.  Jessica Drake has agreed to follow-up with our clinic in 4 weeks. She was informed of the importance of frequent follow-up visits to maximize her success with intensive lifestyle modifications for her  multiple health conditions.   Objective:   Blood pressure 123/75, pulse 69, temperature 97.8 F (36.6 C), height '5\' 8"'$  (1.727 m), SpO2 100 %. Body mass index is 32.84 kg/m.  General: Cooperative, alert, well developed, in no acute distress. HEENT: Conjunctivae and lids unremarkable. Cardiovascular: Regular rhythm.  Lungs: Normal work of breathing. Neurologic: No focal deficits.   Lab Results  Component Value Date   CREATININE 0.94 03/10/2022   BUN 16 03/10/2022   NA 143 03/10/2022   K 5.0 03/10/2022   CL 104 03/10/2022   CO2 20 03/10/2022   Lab Results  Component Value Date   ALT 19 03/10/2022   AST 22 03/10/2022   ALKPHOS 60 03/10/2022   BILITOT 0.5 03/10/2022   Lab Results  Component Value Date   HGBA1C 5.3 03/10/2022   HGBA1C 5.4 02/04/2021   HGBA1C 5.4 10/10/2020   HGBA1C 5.7 (H) 04/22/2020   Lab Results  Component Value Date   INSULIN 5.7 03/10/2022   INSULIN 6.8 02/04/2021   INSULIN 6.0 10/10/2020   INSULIN 10.7 04/22/2020   No results found for: "TSH" Lab Results  Component Value Date   CHOL 238 (H) 03/10/2022   HDL 111 03/10/2022   LDLCALC 118 (H) 03/10/2022   TRIG 56 03/10/2022   Lab Results  Component Value Date   VD25OH 65.3 03/10/2022   VD25OH 81.9 07/14/2021   VD25OH 71.5 02/04/2021    Attestation Statements:   Reviewed by clinician on day of visit: allergies, medications, problem list, medical history, surgical history, family history, social history, and previous encounter notes.  I, Kathlene November, BS, CMA, am acting as transcriptionist for [employer],  DO.  I have reviewed the above documentation for accuracy and completeness, and I agree with the above. Jearld Lesch, DO

## 2022-12-07 ENCOUNTER — Encounter: Payer: Self-pay | Admitting: Bariatrics

## 2022-12-17 ENCOUNTER — Encounter: Payer: Self-pay | Admitting: Bariatrics

## 2022-12-17 ENCOUNTER — Other Ambulatory Visit (HOSPITAL_COMMUNITY): Payer: Self-pay

## 2022-12-17 ENCOUNTER — Ambulatory Visit: Payer: BC Managed Care – PPO | Admitting: Bariatrics

## 2022-12-17 VITALS — BP 115/73 | HR 66 | Temp 97.8°F | Ht 68.0 in | Wt 217.0 lb

## 2022-12-17 DIAGNOSIS — R7303 Prediabetes: Secondary | ICD-10-CM

## 2022-12-17 DIAGNOSIS — R632 Polyphagia: Secondary | ICD-10-CM

## 2022-12-17 DIAGNOSIS — I10 Essential (primary) hypertension: Secondary | ICD-10-CM

## 2022-12-17 DIAGNOSIS — Z6832 Body mass index (BMI) 32.0-32.9, adult: Secondary | ICD-10-CM

## 2022-12-17 DIAGNOSIS — E669 Obesity, unspecified: Secondary | ICD-10-CM

## 2022-12-17 MED ORDER — WEGOVY 1 MG/0.5ML ~~LOC~~ SOAJ
1.0000 mg | SUBCUTANEOUS | 0 refills | Status: DC
  Filled 2022-12-17: qty 2, 28d supply, fill #0

## 2022-12-17 NOTE — Progress Notes (Signed)
WEIGHT SUMMARY AND BIOMETRICS  Weight Lost Since Last Visit: 3lb  Vitals Temp: 97.8 F (36.6 C) BP: 115/73 Pulse Rate: 66 SpO2: 99 %   Anthropometric Measurements Height: '5\' 8"'$  (1.727 m) Weight: 217 lb (98.4 kg) BMI (Calculated): 33 Weight at Last Visit: 220lb Weight Lost Since Last Visit: 3lb Starting Weight: 244lb Total Weight Loss (lbs): 27 lb (12.2 kg)   Body Composition  Body Fat %: 40.7 % Fat Mass (lbs): 88.6 lbs Muscle Mass (lbs): 122.6 lbs Total Body Water (lbs): 89 lbs Visceral Fat Rating : 12   Other Clinical Data Fasting: no Labs: no Today's Visit #: 50 Starting Date: 04/22/20    OBESITY Jessica Drake is here to discuss her progress with her obesity treatment plan along with follow-up of her obesity related diagnoses.     Nutrition Plan: the Category 1 plan - 65% adherence.  Current exercise:  Gym  Interim History:  She is down an additional 3 lbs.  Protein intake is as prescribed, Is not skipping meals, and Meeting protein goals.  Pharmacotherapy: Toccarra is on Wegovy 0.50 mg SQ weekly Adverse side effects: None Hunger is moderately controlled.  Cravings are moderately controlled.  Assessment/Plan:  1. Prediabetes Last A1c was 5.3  Medication(s): Wegovy Wegovy 1.0 mg SQ weekly Lab Results  Component Value Date   HGBA1C 5.3 03/10/2022   HGBA1C 5.4 02/04/2021   HGBA1C 5.4 10/10/2020   HGBA1C 5.7 (H) 04/22/2020   Lab Results  Component Value Date   INSULIN 5.7 03/10/2022   INSULIN 6.8 02/04/2021   INSULIN 6.0 10/10/2020   INSULIN 10.7 04/22/2020    Plan: Information sheet on " Insulin Resistance and Prediabetes".  Will minimize all refined carbohydrates both sweets and starches.  Will work on the plan and exercise.  Consider both aerobic and resistance training.  Will keep protein, water, and fiber intake high.  Increase Polyunsaturated and Monounsaturated fats to increase satiety and encourage weight loss.  Aim for 7 to  9 hours of sleep nightly.  Will continue medications.  Continue and refill Wegovy 1.0 mg SQ weekly   2. Hypertension Hypertension well controlled.  Medication(s): Cozaar  BP Readings from Last 3 Encounters:  12/17/22 115/73  11/24/22 123/75  10/13/22 122/74   Lab Results  Component Value Date   CREATININE 0.94 03/10/2022   CREATININE 0.91 07/14/2021   CREATININE 0.91 02/04/2021   Plan: Continue all antihypertensives at current dosages. No added salt.    3. Polyphagia Jessica Drake endorses excessive hunger.  Medication(s): [id number]  Effects of medication:  moderately controlled. Cravings are moderately controlled.   Plan: Medication(s): Wegovy 1.0 mg SQ weekly Will increase water, protein and fiber to help assuage hunger.  Will minimize foods that have a high glucose index/load to minimize reactive hypoglycemia.     Generalized Obesity: Current BMI 32 Pharmacotherapy Plan Continue and increase dose  Wegovy 1.0 mg SQ weekly  Jessica Drake is currently in the action stage of change. As such, her goal is to continue with weight loss efforts.  She has agreed to the Category 1 plan.  Exercise goals: All adults should avoid inactivity. Some physical activity is better than none, and adults who participate in any amount of physical activity gain some health benefits.  Behavioral modification strategies: meal planning  and increase water intake.  Jessica Drake has agreed to follow-up with our clinic in 4 weeks.   No orders of the defined types were placed in this encounter.   There are no discontinued medications.  No orders of the defined types were placed in this encounter.     Objective:   VITALS: Per patient if applicable, see vitals. GENERAL: Alert and in no acute distress. CARDIOPULMONARY: No increased WOB. Speaking in clear sentences.  PSYCH: Pleasant and cooperative. Speech normal rate and rhythm. Affect is appropriate. Insight and judgement are appropriate. Attention is  focused, linear, and appropriate.  NEURO: Oriented as arrived to appointment on time with no prompting.   Attestation Statements:    This was prepared with the assistance of Presenter, broadcasting.  Occasional wrong-word or sound-a-like substitutions may have occurred due to the inherent limitations of voice recognition software.   Jearld Lesch, DO

## 2022-12-30 DIAGNOSIS — M722 Plantar fascial fibromatosis: Secondary | ICD-10-CM | POA: Diagnosis not present

## 2023-01-15 DIAGNOSIS — H40013 Open angle with borderline findings, low risk, bilateral: Secondary | ICD-10-CM | POA: Diagnosis not present

## 2023-01-18 ENCOUNTER — Ambulatory Visit: Payer: BC Managed Care – PPO | Admitting: Bariatrics

## 2023-02-23 DIAGNOSIS — I10 Essential (primary) hypertension: Secondary | ICD-10-CM | POA: Diagnosis not present

## 2023-02-23 DIAGNOSIS — E042 Nontoxic multinodular goiter: Secondary | ICD-10-CM | POA: Diagnosis not present

## 2023-02-23 DIAGNOSIS — E559 Vitamin D deficiency, unspecified: Secondary | ICD-10-CM | POA: Diagnosis not present

## 2023-02-23 DIAGNOSIS — R1310 Dysphagia, unspecified: Secondary | ICD-10-CM | POA: Diagnosis not present

## 2023-02-24 ENCOUNTER — Encounter: Payer: Self-pay | Admitting: Bariatrics

## 2023-02-24 ENCOUNTER — Other Ambulatory Visit (HOSPITAL_COMMUNITY): Payer: Self-pay

## 2023-02-24 ENCOUNTER — Ambulatory Visit: Payer: BC Managed Care – PPO | Admitting: Bariatrics

## 2023-02-24 VITALS — BP 130/75 | HR 69 | Temp 97.9°F | Ht 68.0 in | Wt 222.0 lb

## 2023-02-24 DIAGNOSIS — E7800 Pure hypercholesterolemia, unspecified: Secondary | ICD-10-CM | POA: Diagnosis not present

## 2023-02-24 DIAGNOSIS — R7303 Prediabetes: Secondary | ICD-10-CM

## 2023-02-24 DIAGNOSIS — R632 Polyphagia: Secondary | ICD-10-CM | POA: Diagnosis not present

## 2023-02-24 DIAGNOSIS — E559 Vitamin D deficiency, unspecified: Secondary | ICD-10-CM

## 2023-02-24 DIAGNOSIS — E669 Obesity, unspecified: Secondary | ICD-10-CM

## 2023-02-24 DIAGNOSIS — I10 Essential (primary) hypertension: Secondary | ICD-10-CM | POA: Diagnosis not present

## 2023-02-24 DIAGNOSIS — Z6833 Body mass index (BMI) 33.0-33.9, adult: Secondary | ICD-10-CM

## 2023-02-24 MED ORDER — WEGOVY 1 MG/0.5ML ~~LOC~~ SOAJ
1.0000 mg | SUBCUTANEOUS | 0 refills | Status: DC
  Filled 2023-02-24 – 2023-03-04 (×3): qty 2, 28d supply, fill #0

## 2023-02-24 NOTE — Progress Notes (Signed)
WEIGHT SUMMARY AND BIOMETRICS  Weight Gained Since Last Visit: 5lb   Vitals Temp: 97.9 F (36.6 C) BP: 130/75 Pulse Rate: 69 SpO2: 98 %   Anthropometric Measurements Height: 5\' 8"  (1.727 m) Weight: 222 lb (100.7 kg) BMI (Calculated): 33.76 Weight at Last Visit: 217lb Weight Lost Since Last Visit: 0 Weight Gained Since Last Visit: 5lb Starting Weight: 244lb Total Weight Loss (lbs): 22 lb (9.979 kg)   Body Composition  Body Fat %: 41.2 % Fat Mass (lbs): 91.8 lbs Muscle Mass (lbs): 124.2 lbs Total Body Water (lbs): 92.2 lbs Visceral Fat Rating : 12   Other Clinical Data Fasting: yes Labs: yes Today's Visit #: 39 Starting Date: 04/22/20    OBESITY Jessica Drake is here to discuss her progress with her obesity treatment plan along with follow-up of her obesity related diagnoses.     Nutrition Plan: the Category 1 plan - 0% adherence.  Current exercise: weightlifting  Interim History:  She is up 5 lbs since her last visit.  Eating all of the food on the plan., Protein intake is as prescribed, and Is exceeding snack calorie allotment  Pharmacotherapy: Jessica Drake is on Wegovy 1.0 mg SQ weekly Adverse side effects: None Hunger is moderately controlled.  Cravings are poorly controlled.  Assessment/Plan:    Jessica Drake endorses excessive hunger.  Medication(s): ZOXWRU  Effects of medication:  moderately controlled. Cravings are moderately controlled.   Plan: Medication(s): Wegovy 1.0 mg SQ weekly Will increase water, protein and fiber to help assuage hunger.  Will minimize foods that have a high glucose index/load to minimize reactive hypoglycemia.   Prediabetes Last A1c was 5.3  Medication(s): Wegovy  Wegovy 1.0 mg SQ weekly Lab Results  Component Value Date   HGBA1C 5.3 03/10/2022   HGBA1C 5.4 02/04/2021   HGBA1C 5.4 10/10/2020   HGBA1C 5.7 (H) 04/22/2020   Lab Results  Component Value Date   INSULIN 5.7 03/10/2022   INSULIN 6.8  02/04/2021   INSULIN 6.0 10/10/2020   INSULIN 10.7 04/22/2020    Plan: Information sheet on " Insulin Resistance and Prediabetes".  Will minimize all refined carbohydrates both sweets and starches.  Will work on the plan and exercise.  Consider both aerobic and resistance training.  Will keep protein, water, and fiber intake high.  Increase Polyunsaturated and Monounsaturated fats to increase satiety and encourage weight loss.  Aim for 7 to 9 hours of sleep nightly.  Will continue medications.   Continue and refill Wegovy 1.0 mg SQ weekly   Elevated Cholesterol:   No medications  Plan: Will check lipids today.   Essential HTN:   Taking Cozaar  Plan: Continue medications.   Vitamin D Deficiency Vitamin D is at goal of 50.  Most recent vitamin D level was 65.3. She is on  prescription ergocalciferol 50,000 IU weekly. Lab Results  Component Value Date   VD25OH 65.3 03/10/2022   VD25OH 81.9 07/14/2021   VD25OH 71.5 02/04/2021    Plan: Continue prescription vitamin D 50,000 IU weekly.     Generalized Obesity: Current BMI BMI (Calculated): 33.76   Labs done today (CMP, Lipids, HgbA1c, insulin, vitamin D).   Pharmacotherapy Plan Continue and refill  Wegovy 1.0 mg SQ weekly  (will increase at her next visit)  Jessica Drake is currently in the action stage of change. As such, her goal is to continue with weight loss efforts.  She has agreed to the Category 1 plan.  Exercise goals: For substantial health benefits, adults should do at least  150 minutes (2 hours and 30 minutes) a week of moderate-intensity, or 75 minutes (1 hour and 15 minutes) a week of vigorous-intensity aerobic physical activity, or an equivalent combination of moderate- and vigorous-intensity aerobic activity. Aerobic activity should be performed in episodes of at least 10 minutes, and preferably, it should be spread throughout the week.  Behavioral modification strategies: meal planning , increase water  intake, better snacking choices, and planning for success.  Jessica Drake has agreed to follow-up with our clinic in 2 weeks ( increased frequency of visits to get her back on track.   Orders Placed This Encounter  Procedures   Hemoglobin A1c   Insulin, random   Lipid Panel With LDL/HDL Ratio   VITAMIN D 25 Hydroxy (Vit-D Deficiency, Fractures)   Comprehensive metabolic panel    Medications Discontinued During This Encounter  Medication Reason   Semaglutide-Weight Management (WEGOVY) 1 MG/0.5ML SOAJ Reorder     Meds ordered this encounter  Medications   Semaglutide-Weight Management (WEGOVY) 1 MG/0.5ML SOAJ    Sig: Inject 1 mg into the skin once a week.    Dispense:  2 mL    Refill:  0      Objective:   VITALS: Per patient if applicable, see vitals. GENERAL: Alert and in no acute distress. CARDIOPULMONARY: No increased WOB. Speaking in clear sentences.  PSYCH: Pleasant and cooperative. Speech normal rate and rhythm. Affect is appropriate. Insight and judgement are appropriate. Attention is focused, linear, and appropriate.  NEURO: Oriented as arrived to appointment on time with no prompting.   Attestation Statements:    This was prepared with the assistance of Engineer, civil (consulting).  Occasional wrong-word or sound-a-like substitutions may have occurred due to the inherent limitations of voice recognition software.   Corinna Capra, DO

## 2023-02-25 ENCOUNTER — Other Ambulatory Visit: Payer: Self-pay | Admitting: Internal Medicine

## 2023-02-25 DIAGNOSIS — R1310 Dysphagia, unspecified: Secondary | ICD-10-CM

## 2023-02-25 DIAGNOSIS — E042 Nontoxic multinodular goiter: Secondary | ICD-10-CM

## 2023-02-25 LAB — COMPREHENSIVE METABOLIC PANEL
ALT: 20 IU/L (ref 0–32)
AST: 16 IU/L (ref 0–40)
Albumin/Globulin Ratio: 2.3 — ABNORMAL HIGH (ref 1.2–2.2)
Albumin: 4.5 g/dL (ref 3.9–4.9)
Alkaline Phosphatase: 55 IU/L (ref 44–121)
BUN/Creatinine Ratio: 19 (ref 12–28)
BUN: 18 mg/dL (ref 8–27)
Bilirubin Total: 0.5 mg/dL (ref 0.0–1.2)
CO2: 20 mmol/L (ref 20–29)
Calcium: 9.2 mg/dL (ref 8.7–10.3)
Chloride: 104 mmol/L (ref 96–106)
Creatinine, Ser: 0.97 mg/dL (ref 0.57–1.00)
Globulin, Total: 2 g/dL (ref 1.5–4.5)
Glucose: 92 mg/dL (ref 70–99)
Potassium: 4.5 mmol/L (ref 3.5–5.2)
Sodium: 139 mmol/L (ref 134–144)
Total Protein: 6.5 g/dL (ref 6.0–8.5)
eGFR: 66 mL/min/{1.73_m2} (ref >59)

## 2023-02-25 LAB — HEMOGLOBIN A1C
Est. average glucose Bld gHb Est-mCnc: 108 mg/dL
Hgb A1c MFr Bld: 5.4 % (ref 4.8–5.6)

## 2023-02-25 LAB — LIPID PANEL WITH LDL/HDL RATIO
Cholesterol, Total: 235 mg/dL — ABNORMAL HIGH (ref 100–199)
HDL: 95 mg/dL (ref >39)
LDL Chol Calc (NIH): 129 mg/dL — ABNORMAL HIGH (ref 0–99)
LDL/HDL Ratio: 1.4 ratio (ref 0.0–3.2)
Triglycerides: 65 mg/dL (ref 0–149)
VLDL Cholesterol Cal: 11 mg/dL (ref 5–40)

## 2023-02-25 LAB — INSULIN, RANDOM: INSULIN: 4.3 u[IU]/mL (ref 2.6–24.9)

## 2023-02-25 LAB — VITAMIN D 25 HYDROXY (VIT D DEFICIENCY, FRACTURES): Vit D, 25-Hydroxy: 51.6 ng/mL (ref 30.0–100.0)

## 2023-03-02 ENCOUNTER — Other Ambulatory Visit (HOSPITAL_COMMUNITY): Payer: Self-pay

## 2023-03-03 ENCOUNTER — Other Ambulatory Visit (HOSPITAL_COMMUNITY): Payer: Self-pay

## 2023-03-04 ENCOUNTER — Other Ambulatory Visit (HOSPITAL_COMMUNITY): Payer: Self-pay

## 2023-03-04 ENCOUNTER — Other Ambulatory Visit: Payer: Self-pay

## 2023-03-09 ENCOUNTER — Other Ambulatory Visit (HOSPITAL_COMMUNITY): Payer: Self-pay

## 2023-03-09 ENCOUNTER — Ambulatory Visit: Payer: BC Managed Care – PPO | Admitting: Bariatrics

## 2023-03-09 DIAGNOSIS — R7303 Prediabetes: Secondary | ICD-10-CM

## 2023-03-09 DIAGNOSIS — R632 Polyphagia: Secondary | ICD-10-CM | POA: Diagnosis not present

## 2023-03-09 DIAGNOSIS — E669 Obesity, unspecified: Secondary | ICD-10-CM | POA: Diagnosis not present

## 2023-03-09 DIAGNOSIS — Z6833 Body mass index (BMI) 33.0-33.9, adult: Secondary | ICD-10-CM

## 2023-03-09 MED ORDER — WEGOVY 1.7 MG/0.75ML ~~LOC~~ SOAJ
1.7000 mg | SUBCUTANEOUS | 0 refills | Status: DC
  Filled 2023-03-09: qty 3, 28d supply, fill #0

## 2023-03-09 NOTE — Progress Notes (Signed)
WEIGHT SUMMARY AND BIOMETRICS  Weight Lost Since Last Visit: 1lb   Vitals Temp: 98.3 F (36.8 C) BP: 122/68 Pulse Rate: 63 SpO2: 98 %   Anthropometric Measurements Height: 5\' 8"  (1.727 m) Weight: 221 lb (100.2 kg) BMI (Calculated): 33.61 Weight at Last Visit: 222lb Weight Lost Since Last Visit: 1lb Starting Weight: 244lb Total Weight Loss (lbs): 23 lb (10.4 kg)   Body Composition  Body Fat %: 41.3 % Fat Mass (lbs): 91.4 lbs Muscle Mass (lbs): 123.2 lbs Total Body Water (lbs): 90 lbs Visceral Fat Rating : 12   Other Clinical Data Fasting: no Labs: no Today's Visit #: 40 Starting Date: 04/22/20    OBESITY Jessica Drake is here to discuss her progress with her obesity treatment plan along with follow-up of her obesity related diagnoses.     Nutrition Plan: the Category 1 plan - 40% adherence.  Current exercise:Weightlifting.  Interim History:  She is down 1 additional lb.  Protein intake is as prescribed and Not meeting calorie goals.  Pharmacotherapy: Mora is on Wegovy 1.0 mg SQ weekly Adverse side effects: None Hunger is moderately controlled.  Cravings are moderately controlled.  Assessment/Plan:   1. Prediabetes  Last A1c was 5.4  Medication(s): Wegovy 1.0 mg Wegovy 1.0 mg SQ weekly Lab Results  Component Value Date   HGBA1C 5.4 02/24/2023   HGBA1C 5.3 03/10/2022   HGBA1C 5.4 02/04/2021   HGBA1C 5.4 10/10/2020   HGBA1C 5.7 (H) 04/22/2020   Lab Results  Component Value Date   INSULIN 4.3 02/24/2023   INSULIN 5.7 03/10/2022   INSULIN 6.8 02/04/2021   INSULIN 6.0 10/10/2020   INSULIN 10.7 04/22/2020    Plan: Information sheet on " Insulin Resistance and Prediabetes".  Will minimize all refined carbohydrates both sweets and starches.  Will work on the plan and exercise.  Consider both aerobic and resistance training.  Will keep protein, water, and fiber intake high.  Increase Polyunsaturated and Monounsaturated fats to  increase satiety and encourage weight loss.  Aim for 7 to 9 hours of sleep nightly.  Will continue medications.   Continue and increase dose Wegovy 1.7 SQ weekly   2. Polyphagia  Jessica Drake endorses excessive hunger.  Medication(s): EAVWUJ  Effects of medication:  moderately controlled. Cravings are moderately controlled.   Plan: Medication(s): Wegovy 1.7 SQ weekly Will increase water, protein and fiber to help assuage hunger.  Will minimize foods that have a high glucose index/load to minimize reactive hypoglycemia.   Labs reviewed today (CMP, Lipids, HgbA1c, insulin, vitamin D).    Generalized Obesity: Current BMI BMI (Calculated): 33.61   Pharmacotherapy Plan Continue and increase dose  Wegovy 1.7 SQ weekly  Jessica Drake is currently in the action stage of change. As such, her goal is to continue with weight loss efforts.  She has agreed to the Category 1 plan.  Exercise goals: For substantial health benefits, adults should do at least 150 minutes (2 hours and 30 minutes) a week of moderate-intensity, or 75 minutes (1 hour and 15 minutes) a week of vigorous-intensity aerobic physical activity, or an equivalent combination of moderate- and vigorous-intensity aerobic activity. Aerobic activity should be performed in episodes of at least 10 minutes, and preferably, it should be spread throughout the week.  Behavioral modification strategies: no meal skipping and meal planning .  Jessica Drake has agreed to follow-up with our clinic in 4 weeks.     Objective:   VITALS: Per patient if applicable, see vitals. GENERAL: Alert and in no acute distress.  CARDIOPULMONARY: No increased WOB. Speaking in clear sentences.  PSYCH: Pleasant and cooperative. Speech normal rate and rhythm. Affect is appropriate. Insight and judgement are appropriate. Attention is focused, linear, and appropriate.  NEURO: Oriented as arrived to appointment on time with no prompting.   Attestation Statements:    This was  prepared with the assistance of Engineer, civil (consulting).  Occasional wrong-word or sound-a-like substitutions may have occurred due to the inherent limitations of voice recognition software.   Jessica Capra, DO

## 2023-03-11 ENCOUNTER — Other Ambulatory Visit (HOSPITAL_COMMUNITY): Payer: Self-pay

## 2023-03-22 ENCOUNTER — Ambulatory Visit
Admission: RE | Admit: 2023-03-22 | Discharge: 2023-03-22 | Disposition: A | Discharge status: Home / Self Care | Payer: BC Managed Care – PPO | Source: Ambulatory Visit | Attending: Internal Medicine | Admitting: Internal Medicine

## 2023-03-22 DIAGNOSIS — E040 Nontoxic diffuse goiter: Secondary | ICD-10-CM | POA: Diagnosis not present

## 2023-03-22 DIAGNOSIS — R1310 Dysphagia, unspecified: Secondary | ICD-10-CM

## 2023-03-22 DIAGNOSIS — E042 Nontoxic multinodular goiter: Secondary | ICD-10-CM

## 2023-03-22 DIAGNOSIS — E041 Nontoxic single thyroid nodule: Secondary | ICD-10-CM | POA: Diagnosis not present

## 2023-03-29 ENCOUNTER — Other Ambulatory Visit: Payer: BC Managed Care – PPO

## 2023-04-06 DIAGNOSIS — M5417 Radiculopathy, lumbosacral region: Secondary | ICD-10-CM | POA: Diagnosis not present

## 2023-04-07 ENCOUNTER — Other Ambulatory Visit (HOSPITAL_COMMUNITY): Payer: Self-pay

## 2023-04-07 ENCOUNTER — Encounter: Payer: Self-pay | Admitting: Bariatrics

## 2023-04-07 ENCOUNTER — Ambulatory Visit: Payer: BC Managed Care – PPO | Admitting: Bariatrics

## 2023-04-07 VITALS — BP 126/76 | HR 69 | Temp 97.7°F | Ht 68.0 in | Wt 215.0 lb

## 2023-04-07 DIAGNOSIS — E669 Obesity, unspecified: Secondary | ICD-10-CM | POA: Diagnosis not present

## 2023-04-07 DIAGNOSIS — Z6832 Body mass index (BMI) 32.0-32.9, adult: Secondary | ICD-10-CM | POA: Diagnosis not present

## 2023-04-07 DIAGNOSIS — R632 Polyphagia: Secondary | ICD-10-CM

## 2023-04-07 DIAGNOSIS — R7303 Prediabetes: Secondary | ICD-10-CM

## 2023-04-07 MED ORDER — WEGOVY 1.7 MG/0.75ML ~~LOC~~ SOAJ
1.7000 mg | SUBCUTANEOUS | 0 refills | Status: DC
  Filled 2023-04-07: qty 3, 28d supply, fill #0

## 2023-04-07 NOTE — Progress Notes (Signed)
WEIGHT SUMMARY AND BIOMETRICS  Weight Lost Since Last Visit: 6lb  Vitals Temp: 97.7 F (36.5 C) BP: 126/76 Pulse Rate: 69 SpO2: 98 %   Anthropometric Measurements Height: 5\' 8"  (1.727 m) Weight: 215 lb (97.5 kg) BMI (Calculated): 32.7 Weight at Last Visit: 221lb Weight Lost Since Last Visit: 6lb Starting Weight: 244lb Total Weight Loss (lbs): 29 lb (13.2 kg)   Body Composition  Body Fat %: 38.2 % Fat Mass (lbs): 82.6 lbs Muscle Mass (lbs): 126.4 lbs Total Body Water (lbs): 39.7 lbs Visceral Fat Rating : 11   Other Clinical Data Fasting: no Labs: no Today's Visit #: 59 Starting Date: 04/22/20    OBESITY Jessica Drake is here to discuss her progress with her obesity treatment plan along with follow-up of her obesity related diagnoses.     Nutrition Plan: low carbohydrate plan - 65% adherence.  Current exercise: cardiovascular workout on exercise equipment and weightlifting  Interim History:  She is down an additional 6 lbs. She has started on Prednisone.  Eating all of the food on the plan., Protein intake is as prescribed, and Water intake is adequate. Will continue to keep water, fiber, and protein high. Will have a protein shake if appetite surge or after exercise.   Pharmacotherapy: Jessica Drake is on Wegovy 1.7 SQ weekly Adverse side effects: None Hunger is moderately controlled.  Cravings are moderately controlled.  Assessment/Plan:   Jessica Drake endorses excessive hunger.  Medication(s): ZOXWRU Effects of medication:  moderately controlled. Cravings are well controlled.   Plan: Medication(s): Wegovy 1.7 SQ weekly Will increase water, protein and fiber to help assuage hunger.  Will minimize foods that have a high glucose index/load to minimize reactive hypoglycemia.   Prediabetes Last A1c was 5.7  Medication(s): Wegovy 1.7 SQ weekly Lab Results  Component Value Date   HGBA1C 5.4 02/24/2023   HGBA1C 5.3 03/10/2022   HGBA1C 5.4  02/04/2021   HGBA1C 5.4 10/10/2020   HGBA1C 5.7 (H) 04/22/2020   Lab Results  Component Value Date   INSULIN 4.3 02/24/2023   INSULIN 5.7 03/10/2022   INSULIN 6.8 02/04/2021   INSULIN 6.0 10/10/2020   INSULIN 10.7 04/22/2020    Plan: Will minimize all refined carbohydrates both sweets and starches.  Will work on the plan and exercise.  Consider both aerobic and resistance training.  Will keep protein, water, and fiber intake high.  Will  continue medications.  Continue and refill Wegovy 1.7 SQ weekly     Generalized Obesity: Current BMI BMI (Calculated): 32.7   Pharmacotherapy Plan Continue and refill  Wegovy 1.7 SQ weekly  Jessica Drake is currently in the action stage of change. As such, her goal is to continue with weight loss efforts.  She has agreed to keeping a food journal with goal of 1,200 calories and 80 grams of protein daily.  Exercise goals: For substantial health benefits, adults should do at least 150 minutes (2 hours and 30 minutes) a week of moderate-intensity, or 75 minutes (1 hour and 15 minutes) a week of vigorous-intensity aerobic physical activity, or an equivalent combination of moderate- and vigorous-intensity aerobic activity. Aerobic activity should be performed in episodes of at least 10 minutes, and preferably, it should be spread throughout the week.  Behavioral modification strategies: increasing lean protein intake, decreasing simple carbohydrates , no meal skipping, meal planning , increase water intake, better snacking choices, and planning for success.  Jessica Drake has agreed to follow-up with our clinic in 4 weeks.   No orders of the defined  types were placed in this encounter.   Medications Discontinued During This Encounter  Medication Reason   Insulin Pen Needle 32G X 4 MM MISC Patient Preference          Objective:   VITALS: Per patient if applicable, see vitals. GENERAL: Alert and in no acute distress. CARDIOPULMONARY: No increased WOB.  Speaking in clear sentences.  PSYCH: Pleasant and cooperative. Speech normal rate and rhythm. Affect is appropriate. Insight and judgement are appropriate. Attention is focused, linear, and appropriate.  NEURO: Oriented as arrived to appointment on time with no prompting.   Attestation Statements:    This was prepared with the assistance of Engineer, civil (consulting).  Occasional wrong-word or sound-a-like substitutions may have occurred due to the inherent limitations of voice recognition software.   Jessica Capra, DO

## 2023-04-23 ENCOUNTER — Ambulatory Visit
Admission: EM | Admit: 2023-04-23 | Discharge: 2023-04-23 | Disposition: A | Discharge status: Home / Self Care | Payer: BC Managed Care – PPO | Attending: Nurse Practitioner | Admitting: Nurse Practitioner

## 2023-04-23 DIAGNOSIS — M5442 Lumbago with sciatica, left side: Secondary | ICD-10-CM | POA: Diagnosis not present

## 2023-04-23 MED ORDER — DEXAMETHASONE SODIUM PHOSPHATE 10 MG/ML IJ SOLN
10.0000 mg | INTRAMUSCULAR | Status: AC
  Administered 2023-04-23: 10 mg via INTRAMUSCULAR

## 2023-04-23 MED ORDER — KETOROLAC TROMETHAMINE 30 MG/ML IJ SOLN
30.0000 mg | Freq: Once | INTRAMUSCULAR | Status: AC
  Administered 2023-04-23: 30 mg via INTRAMUSCULAR

## 2023-04-23 NOTE — ED Triage Notes (Signed)
Pt presents with lower back pain on left side that started yesterday. Has recurrent back pain and was prescribed prednisone 04/07/23 and states it felt better but went to vacuum yesterday and is now having worsening back pain. Taking tylenol, Advil and tizanidine.

## 2023-04-23 NOTE — Discharge Instructions (Signed)
You were given injections of Toradol 30mg  and Decadron 10mg  today. Do not take Ibuprofen, Advil, or Aleve today. You can take Tylenol Arthritis Strength tablets for breakthrough pain.  Take medication as prescribed. Try to remain as active as possible. Gentle range of motion and stretching exercises to help with back spasm and pain. May apply ice or heat as needed.  Ice is recommended for pain or swelling, heat for spasm or stiffness.  Apply for 20 minutes, remove for 1 hour, then repeat. May take over-the-counter Tylenol Arthritis Strength 650 mg tablet approximately 1 -2 hours after taking ibuprofen for breakthrough pain. Go to the emergency department immediately if you develop weakness in your legs or feet, inability to walk, loss of bowel or bladder function, difficulty urinating or passing a bowel movement, or other concerns. Follow-up with your PCP to discuss continued symptoms and need for further imaging. Follow-up as needed.

## 2023-04-23 NOTE — ED Provider Notes (Signed)
[facility]    CSN: [id number] Arrival date & time: 04/23/23  1524      History   Chief Complaint Chief Complaint  Patient presents with   Back Pain    HPI Jessica Drake is a 62 y.o. female.   The history is provided by the patient.   The patient presents for low back pain. Symptoms started 1 day after she started vacuuming. She was seen for the same or similar symptoms on 04/06/23 by her PCP and was prescribed Tizanidine and Prednisone. Patient states symptoms improved, but she continue to have the pain. She states she felt like she could do some of her normal activities and tried to vacuum. She states she felt the pain afterwards. Pain is located in the mid lower back and radiates into the LLE. She denies fever, chills, loss of bowel/bladder function, numbness or tingling in LEs. She reports she has been taking Tylenol, Advil and Tizanidine. She reports a prior history of low back pain.  Past Medical History:  Diagnosis Date   Back pain    High blood pressure     Patient Active Problem List   Diagnosis Date Noted   Generalized obesity 11/24/2022   BMI 33.0-33.9,adult 11/24/2022   Polyphagia 09/15/2022   Class 2 severe obesity with serious comorbidity and body mass index (BMI) of 37.0 to 37.9 in adult [facility]) 08/12/2022   Essential hypertension 06/24/2022   Pre-diabetes 06/24/2022   Elevated cholesterol 03/11/2022   Vitamin D deficiency 03/10/2022   Insulin resistance 03/10/2022   Multiple thyroid nodules 08/12/2021   At risk for diabetes mellitus 01/16/2021   Class 2 obesity due to excess calories with body mass index (BMI) of 38.0 to 38.9 in adult 05/07/2020   Prediabetes 04/23/2020   Hypertensive disorder 07/10/2019    Past Surgical History:  Procedure Laterality Date   DILATION AND CURETTAGE OF UTERUS      OB History     Gravida  1   Para  1   Term      Preterm      AB      Living         SAB      IAB      Ectopic      Multiple       Live Births               Home Medications    Prior to Admission medications   Medication Sig Start Date End Date Taking? Authorizing Provider  calcium citrate-vitamin D 500-500 MG-UNIT chewable tablet Chew by mouth.   Yes [provider]  losartan (COZAAR) 50 MG tablet Take 50 mg by mouth daily.   Yes [provider]  Multiple Vitamins-Minerals (CENTRUM SILVER 50+WOMEN PO) Take by mouth.   Yes [provider]  predniSONE (DELTASONE) 10 MG tablet 5 tablets once a day for 2 days, 4 tablets once a day for 2 days, 3 tablets once a day for 2 days, 2 tablets once a day for 2 days Orally for 8 days 04/06/23  Yes [provider]  Semaglutide-Weight Management (WEGOVY) 1.7 MG/0.75ML SOAJ Inject 1.7 mg into the skin once a week. 04/07/23  Yes Corinna Capra A, DO  tiZANidine (ZANAFLEX) 4 MG tablet 1 tablet at bedtime as needed Orally Once a day for 15 days 04/06/23  Yes [provider]    Family History Family History  Problem Relation Age of Onset   High blood pressure Mother  High Cholesterol Mother    Cancer Mother    Obesity Mother    Diabetes Father    High blood pressure Father     Social History Social History   Tobacco Use   Smoking status: Never   Smokeless tobacco: Never  Substance Use Topics   Alcohol use: Never   Drug use: Never     Allergies   Patient has no known allergies.   Review of Systems Review of Systems Per HPI  Physical Exam Triage Vital Signs ED Triage Vitals  Encounter Vitals Group     BP 04/23/23 1631 134/82     Systolic BP Percentile --      Diastolic BP Percentile --      Pulse Rate 04/23/23 1631 84     Resp 04/23/23 1631 16     Temp 04/23/23 1631 98.4 F (36.9 C)     Temp Source 04/23/23 1631 Oral     SpO2 04/23/23 1631 95 %     Weight --      Height --      Head Circumference --      Peak Flow --      Pain Score 04/23/23 1632 7     Pain Loc --      Pain Education --      Exclude  from Growth Chart --    No data found.  Updated Vital Signs BP 134/82 (BP Location: Right Arm)   Pulse 84   Temp 98.4 F (36.9 C) (Oral)   Resp 16   SpO2 95%   Visual Acuity Right Eye Distance:   Left Eye Distance:   Bilateral Distance:    Right Eye Near:   Left Eye Near:    Bilateral Near:     Physical Exam Vitals and nursing note reviewed.  Constitutional:      General: She is not in acute distress.    Appearance: Normal appearance.  HENT:     Head: Normocephalic.  Eyes:     Extraocular Movements: Extraocular movements intact.     Pupils: Pupils are equal, round, and reactive to light.  Cardiovascular:     Rate and Rhythm: Normal rate and regular rhythm.     Pulses: Normal pulses.     Heart sounds: Normal heart sounds.  Pulmonary:     Effort: Pulmonary effort is normal. No respiratory distress.     Breath sounds: Normal breath sounds. No stridor. No wheezing, rhonchi or rales.  Abdominal:     General: Bowel sounds are normal.     Palpations: Abdomen is soft.  Musculoskeletal:     Cervical back: Normal range of motion.     Lumbar back: Spasms and tenderness present. No swelling or deformity. Decreased range of motion. Negative right straight leg raise test and negative left straight leg raise test.       Back:  Skin:    General: Skin is warm and dry.  Neurological:     General: No focal deficit present.     Mental Status: She is alert and oriented to person, place, and time.  Psychiatric:        Mood and Affect: Mood normal.        Behavior: Behavior normal.      UC Treatments / Results  Labs (all labs ordered are listed, but only abnormal results are displayed) Labs Reviewed - No data to display  EKG   Radiology No results found.  Procedures Procedures (including critical care time)  Medications Ordered in UC Medications  dexamethasone (DECADRON) injection 10 mg (has no administration in time range)  ketorolac (TORADOL) 30 MG/ML injection  30 mg (has no administration in time range)    Initial Impression / Assessment and Plan / UC Course  I have reviewed the triage vital signs and the nursing notes.  Pertinent labs & imaging results that were available during my care of the patient were reviewed by me and considered in my medical decision making (see chart for details).  The patient is well-appearing, she is in NAD, vital signs are stable.  Patient with continued low back pain. Decadron 10mg  and Toradol 30mg  IM given. Tizanidine 4mg  prescribed for muscle spasm. Supportive care recommendations provided and discussed with the patient to include OTC analgesics for pain, use of ice or heat, and stretching exercises. Patient advised to follow-up with PCP to discuss imaging such as an MRI. Patient was given strict ER follow-up precautions. The patient is in agreement with this plan of care and verbalizes understanding, all questions were answered, patient is stable for discharge.    Final Clinical Impressions(s) / UC Diagnoses   Final diagnoses:  Midline low back pain with left-sided sciatica, unspecified chronicity     Discharge Instructions      You were given injections of Toradol 30mg  and Decadron 10mg  today. Do not take Ibuprofen, Advil, or Aleve today. You can take Tylenol Arthritis Strength tablets for breakthrough pain.  Take medication as prescribed. Try to remain as active as possible. Gentle range of motion and stretching exercises to help with back spasm and pain. May apply ice or heat as needed.  Ice is recommended for pain or swelling, heat for spasm or stiffness.  Apply for 20 minutes, remove for 1 hour, then repeat. May take over-the-counter Tylenol Arthritis Strength 650 mg tablet approximately 1 -2 hours after taking ibuprofen for breakthrough pain. Go to the emergency department immediately if you develop weakness in your legs or feet, inability to walk, loss of bowel or bladder function, difficulty urinating  or passing a bowel movement, or other concerns. Follow-up with your PCP to discuss continued symptoms and need for further imaging. Follow-up as needed.      ED Prescriptions   None    PDMP not reviewed this encounter.   Abran Cantor, NP 04/23/23 1713

## 2023-04-25 DIAGNOSIS — S3992XA Unspecified injury of lower back, initial encounter: Secondary | ICD-10-CM | POA: Diagnosis not present

## 2023-04-25 DIAGNOSIS — M5442 Lumbago with sciatica, left side: Secondary | ICD-10-CM | POA: Diagnosis not present

## 2023-04-27 ENCOUNTER — Other Ambulatory Visit: Payer: Self-pay | Admitting: Family Medicine

## 2023-04-27 DIAGNOSIS — I10 Essential (primary) hypertension: Secondary | ICD-10-CM | POA: Diagnosis not present

## 2023-04-27 DIAGNOSIS — M5432 Sciatica, left side: Secondary | ICD-10-CM

## 2023-04-28 ENCOUNTER — Ambulatory Visit
Admission: RE | Admit: 2023-04-28 | Discharge: 2023-04-28 | Disposition: A | Discharge status: Home / Self Care | Payer: BC Managed Care – PPO | Source: Ambulatory Visit | Attending: Family Medicine | Admitting: Family Medicine

## 2023-04-28 DIAGNOSIS — M5432 Sciatica, left side: Secondary | ICD-10-CM

## 2023-04-28 DIAGNOSIS — M5416 Radiculopathy, lumbar region: Secondary | ICD-10-CM | POA: Diagnosis not present

## 2023-05-05 DIAGNOSIS — Z6834 Body mass index (BMI) 34.0-34.9, adult: Secondary | ICD-10-CM | POA: Diagnosis not present

## 2023-05-05 DIAGNOSIS — M4726 Other spondylosis with radiculopathy, lumbar region: Secondary | ICD-10-CM | POA: Diagnosis not present

## 2023-05-05 DIAGNOSIS — M48061 Spinal stenosis, lumbar region without neurogenic claudication: Secondary | ICD-10-CM | POA: Diagnosis not present

## 2023-05-05 DIAGNOSIS — M4316 Spondylolisthesis, lumbar region: Secondary | ICD-10-CM | POA: Diagnosis not present

## 2023-05-05 DIAGNOSIS — Z1231 Encounter for screening mammogram for malignant neoplasm of breast: Secondary | ICD-10-CM | POA: Diagnosis not present

## 2023-05-05 DIAGNOSIS — Z01419 Encounter for gynecological examination (general) (routine) without abnormal findings: Secondary | ICD-10-CM | POA: Diagnosis not present

## 2023-05-06 ENCOUNTER — Ambulatory Visit: Payer: Self-pay | Admitting: Surgery

## 2023-05-06 DIAGNOSIS — J398 Other specified diseases of upper respiratory tract: Secondary | ICD-10-CM | POA: Diagnosis not present

## 2023-05-06 DIAGNOSIS — E042 Nontoxic multinodular goiter: Secondary | ICD-10-CM | POA: Diagnosis not present

## 2023-05-12 ENCOUNTER — Other Ambulatory Visit (HOSPITAL_COMMUNITY): Payer: Self-pay

## 2023-05-12 ENCOUNTER — Encounter: Payer: Self-pay | Admitting: Bariatrics

## 2023-05-12 ENCOUNTER — Ambulatory Visit: Payer: BC Managed Care – PPO | Admitting: Bariatrics

## 2023-05-12 VITALS — BP 127/77 | HR 68 | Temp 97.7°F | Ht 68.0 in | Wt 219.0 lb

## 2023-05-12 DIAGNOSIS — R632 Polyphagia: Secondary | ICD-10-CM | POA: Diagnosis not present

## 2023-05-12 DIAGNOSIS — E669 Obesity, unspecified: Secondary | ICD-10-CM

## 2023-05-12 DIAGNOSIS — R7303 Prediabetes: Secondary | ICD-10-CM | POA: Diagnosis not present

## 2023-05-12 DIAGNOSIS — Z6833 Body mass index (BMI) 33.0-33.9, adult: Secondary | ICD-10-CM

## 2023-05-12 MED ORDER — WEGOVY 1.7 MG/0.75ML ~~LOC~~ SOAJ
1.7000 mg | SUBCUTANEOUS | 0 refills | Status: DC
  Filled 2023-05-12: qty 3, 28d supply, fill #0

## 2023-05-12 NOTE — Progress Notes (Signed)
WEIGHT SUMMARY AND BIOMETRICS  Weight Gained Since Last Visit: 4lb   Vitals Temp: 97.7 F (36.5 C) BP: 127/77 Pulse Rate: 68 SpO2: 100 %   Anthropometric Measurements Height: 5\' 8"  (1.727 m) Weight: 219 lb (99.3 kg) BMI (Calculated): 33.31 Weight at Last Visit: 215lb Weight Gained Since Last Visit: 4lb Starting Weight: 244lb Total Weight Loss (lbs): 25 lb (11.3 kg)   Body Composition  Body Fat %: 40.5 % Fat Mass (lbs): 88.6 lbs Muscle Mass (lbs): 123.8 lbs Total Body Water (lbs): 94 lbs Visceral Fat Rating : 12   Other Clinical Data Fasting: no Labs: no Today's Visit #: 27 Starting Date: 04/22/20    OBESITY Jessica Drake is here to discuss her progress with her obesity treatment plan along with follow-up of her obesity related diagnoses.     Nutrition Plan: keeping a food journal with goal of 1200 calories and 80 grams of protein daily - 70% adherence.  Current exercise: none  Interim History:  She is up 4 lbs since her last visit. She has back pain and has been taking Prednisone which has increased her appetite.  Eating all of the food on the plan., Protein intake is as prescribed, Is not skipping meals, and Water intake is adequate.  Pharmacotherapy: Jessica Drake is on Wegovy 1.7 SQ weekly Adverse side effects: None Hunger is moderately controlled.  Cravings are moderately controlled.   Assessment/Plan:   Jessica Drake endorses excessive hunger.  Medication(s): Jessica Drake  Effects of medication:  moderately controlled. Cravings are moderately controlled.   Plan: Medication(s): Wegovy 1.7 SQ weekly Will increase water, protein and fiber to help assuage hunger.  Will minimize foods that have a high glucose index/load to minimize reactive hypoglycemia.   Jessica Drake Last A1c was 5.4  Medication(s): Wegovy 1.7 SQ weekly Lab Results  Component Value Date   HGBA1C 5.4 02/24/2023   HGBA1C 5.3 03/10/2022   HGBA1C 5.4 02/04/2021   HGBA1C 5.4  10/10/2020   HGBA1C 5.7 (H) 04/22/2020   Lab Results  Component Value Date   INSULIN 4.3 02/24/2023   INSULIN 5.7 03/10/2022   INSULIN 6.8 02/04/2021   INSULIN 6.0 10/10/2020   INSULIN 10.7 04/22/2020    Plan: Will minimize all refined carbohydrates both sweets and starches.  Will work on the plan and exercise.  Consider both aerobic and resistance training.  Will keep protein, water, and fiber intake high.  Increase Polyunsaturated and Monounsaturated fats to increase satiety and encourage weight loss.  Aim for 7 to 9 hours of sleep nightly.  Will continue medications.   Continue and refill Wegovy 1.7 SQ weekly     Generalized Obesity: Current BMI BMI (Calculated): 33.31   Pharmacotherapy Plan Continue and refill  Wegovy 1.7 SQ weekly  Jessica Drake is currently in the action stage of change. As such, her goal is to continue with weight loss efforts.  She has agreed to keeping a food journal with goal of 1,200 calories and 80 grams of protein daily.  Exercise goals: All adults should avoid inactivity. Some physical activity is better than none, and adults who participate in any amount of physical activity gain some health benefits.  Behavioral modification strategies: increasing lean protein intake, no meal skipping, increasing lower sugar fruits, increasing fiber rich foods, decrease snacking , and keep healthy foods in the home.  Jessica Drake has agreed to follow-up with our clinic in 4 weeks. She is going to have a partial thyroidectomy in September and will need to stop her [facility] before her surgery  per her surgeon's recommendation.     Medications Discontinued During This Encounter  Medication Reason   predniSONE (DELTASONE) 10 MG tablet Patient Preference   tiZANidine (ZANAFLEX) 4 MG tablet Patient Preference          Objective:   VITALS: Per patient if applicable, see vitals. GENERAL: Alert and in no acute distress. CARDIOPULMONARY: No increased WOB. Speaking in clear  sentences.  PSYCH: Pleasant and cooperative. Speech normal rate and rhythm. Affect is appropriate. Insight and judgement are appropriate. Attention is focused, linear, and appropriate.  NEURO: Oriented as arrived to appointment on time with no prompting.   Attestation Statements:   This was prepared with the assistance of Engineer, civil (consulting).  Occasional wrong-word or sound-a-like substitutions may have occurred due to the inherent limitations of voice recognition software.    Corinna Capra, DO

## 2023-05-18 DIAGNOSIS — M47816 Spondylosis without myelopathy or radiculopathy, lumbar region: Secondary | ICD-10-CM | POA: Diagnosis not present

## 2023-06-02 ENCOUNTER — Ambulatory Visit: Payer: BC Managed Care – PPO | Admitting: Bariatrics

## 2023-06-04 NOTE — Patient Instructions (Signed)
DUE TO COVID-19 ONLY TWO VISITORS  (aged 64 and older)  ARE ALLOWED TO COME WITH YOU AND STAY IN THE WAITING ROOM ONLY DURING PRE OP AND PROCEDURE.   **NO VISITORS ARE ALLOWED IN THE SHORT STAY AREA OR RECOVERY ROOM!!**  IF YOU WILL BE ADMITTED INTO THE HOSPITAL YOU ARE ALLOWED ONLY FOUR SUPPORT PEOPLE DURING VISITATION HOURS ONLY (7 AM -8PM)   The support person(s) must pass our screening, gel in and out, and wear a mask at all times, including in the patient's room. Patients must also wear a mask when staff or their support person are in the room. Visitors GUEST BADGE MUST BE WORN VISIBLY  One adult visitor may remain with you overnight and MUST be in the room by 8 P.M.     Your procedure is scheduled on: 06/21/23   Report to [facility] Main Entrance    Report to admitting at : 5:15 AM   Call this number if you have problems the morning of surgery [phone]   Do not eat food or drink :After Midnight.   FOLLOW ANY ADDITIONAL PRE OP INSTRUCTIONS YOU RECEIVED FROM YOUR SURGEON'S OFFICE!!!   Oral Hygiene is also important to reduce your risk of infection.                                    Remember - BRUSH YOUR TEETH THE MORNING OF SURGERY WITH YOUR REGULAR TOOTHPASTE  DENTURES WILL BE REMOVED PRIOR TO SURGERY PLEASE DO NOT APPLY "Poly grip" OR ADHESIVES!!!   Do NOT smoke after Midnight   Take these medicines the morning of surgery with A SIP OF WATER: N/A  DO NOT TAKE THE FOLLOWING 7 DAYS PRIOR TO SURGERY: Ozempic, Wegovy, Rybelsus (Semaglutide), Byetta (exenatide), Bydureon (exenatide ER), Victoza, Saxenda (liraglutide), or Trulicity (dulaglutide) Mounjaro (Tirzepatide) Adlyxin (Lixisenatide), Polyethylene Glycol Loxenatide.  Bring CPAP mask and tubing day of surgery.                              You may not have any metal on your body including hair pins, jewelry, and body piercing             Do not wear make-up, lotions, powders, perfumes/cologne, or  deodorant  Do not wear nail polish including gel and S&S, artificial/acrylic nails, or any other type of covering on natural nails including finger and toenails. If you have artificial nails, gel coating, etc. that needs to be removed by a nail salon please have this removed prior to surgery or surgery may need to be canceled/ delayed if the surgeon/ anesthesia feels like they are unable to be safely monitored.   Do not shave  48 hours prior to surgery.    Do not bring valuables to the hospital. [facility] IS NOT             RESPONSIBLE   FOR VALUABLES.   Contacts, glasses, or bridgework may not be worn into surgery.   Bring small overnight bag day of surgery.   DO NOT BRING YOUR HOME MEDICATIONS TO THE HOSPITAL. PHARMACY WILL DISPENSE MEDICATIONS LISTED ON YOUR MEDICATION LIST TO YOU DURING YOUR ADMISSION IN THE HOSPITAL!    Patients discharged on the day of surgery will not be allowed to drive home.  Someone NEEDS to stay with you for the first 24 hours after anesthesia.  Special Instructions: Bring a copy of your healthcare power of attorney and living will documents         the day of surgery if you haven't scanned them before.              Please read over the following fact sheets you were given: IF YOU HAVE QUESTIONS ABOUT YOUR PRE-OP INSTRUCTIONS PLEASE CALL [phone]    [facility] - Preparing for Surgery Before surgery, you can play an important role.  Because skin is not sterile, your skin needs to be as free of germs as possible.  You can reduce the number of germs on your skin by washing with CHG (chlorahexidine gluconate) soap before surgery.  CHG is an antiseptic cleaner which kills germs and bonds with the skin to continue killing germs even after washing. Please DO NOT use if you have an allergy to CHG or antibacterial soaps.  If your skin becomes reddened/irritated stop using the CHG and inform your nurse when you arrive at Short Stay. Do not shave (including legs and  underarms) for at least 48 hours prior to the first CHG shower.  You may shave your face/neck. Please follow these instructions carefully:  1.  Shower with CHG Soap the night before surgery and the  morning of Surgery.  2.  If you choose to wash your hair, wash your hair first as usual with your  normal  shampoo.  3.  After you shampoo, rinse your hair and body thoroughly to remove the  shampoo.                           4.  Use CHG as you would any other liquid soap.  You can apply chg directly  to the skin and wash                       Gently with a scrungie or clean washcloth.  5.  Apply the CHG Soap to your body ONLY FROM THE NECK DOWN.   Do not use on face/ open                           Wound or open sores. Avoid contact with eyes, ears mouth and genitals (private parts).                       Wash face,  Genitals (private parts) with your normal soap.             6.  Wash thoroughly, paying special attention to the area where your surgery  will be performed.  7.  Thoroughly rinse your body with warm water from the neck down.  8.  DO NOT shower/wash with your normal soap after using and rinsing off  the CHG Soap.                9.  Pat yourself dry with a clean towel.            10.  Wear clean pajamas.            11.  Place clean sheets on your bed the night of your first shower and do not  sleep with pets. Day of Surgery : Do not apply any lotions/deodorants the morning of surgery.  Please wear clean clothes to the hospital/surgery center.  FAILURE TO FOLLOW THESE INSTRUCTIONS MAY RESULT IN  THE CANCELLATION OF YOUR SURGERY PATIENT SIGNATURE_________________________________  NURSE SIGNATURE__________________________________  ________________________________________________________________________

## 2023-06-08 ENCOUNTER — Encounter (HOSPITAL_COMMUNITY)
Admission: RE | Admit: 2023-06-08 | Discharge: 2023-06-08 | Disposition: A | Discharge status: Home / Self Care | Payer: BC Managed Care – PPO | Source: Ambulatory Visit | Attending: Surgery | Admitting: Surgery

## 2023-06-08 ENCOUNTER — Encounter (HOSPITAL_COMMUNITY): Payer: Self-pay

## 2023-06-08 ENCOUNTER — Other Ambulatory Visit: Payer: Self-pay

## 2023-06-08 VITALS — BP 142/86 | HR 85 | Temp 97.9°F | Ht 68.0 in | Wt 226.0 lb

## 2023-06-08 DIAGNOSIS — Z01818 Encounter for other preprocedural examination: Secondary | ICD-10-CM | POA: Diagnosis not present

## 2023-06-08 DIAGNOSIS — I10 Essential (primary) hypertension: Secondary | ICD-10-CM | POA: Insufficient documentation

## 2023-06-08 HISTORY — DX: Unspecified osteoarthritis, unspecified site: M19.90

## 2023-06-08 HISTORY — DX: Prediabetes: R73.03

## 2023-06-08 LAB — CBC
HCT: 40.4 % (ref 36.0–46.0)
Hemoglobin: 13 g/dL (ref 12.0–15.0)
MCH: 30.5 pg (ref 26.0–34.0)
MCHC: 32.2 g/dL (ref 30.0–36.0)
MCV: 94.8 fL (ref 80.0–100.0)
Platelets: 312 10*3/uL (ref 150–400)
RBC: 4.26 MIL/uL (ref 3.87–5.11)
RDW: 12.8 % (ref 11.5–15.5)
WBC: 8.8 10*3/uL (ref 4.0–10.5)
nRBC: 0 % (ref 0.0–0.2)

## 2023-06-08 LAB — BASIC METABOLIC PANEL
Anion gap: 11 (ref 5–15)
BUN: 20 mg/dL (ref 8–23)
CO2: 23 mmol/L (ref 22–32)
Calcium: 8.8 mg/dL — ABNORMAL LOW (ref 8.9–10.3)
Chloride: 105 mmol/L (ref 98–111)
Creatinine, Ser: 0.92 mg/dL (ref 0.44–1.00)
GFR, Estimated: >60 mL/min (ref >60)
Glucose, Bld: 94 mg/dL (ref 70–99)
Potassium: 3.9 mmol/L (ref 3.5–5.1)
Sodium: 139 mmol/L (ref 135–145)

## 2023-06-08 NOTE — Progress Notes (Signed)
For Short Stay: COVID SWAB appointment date:  Bowel Prep reminder:   For Anesthesia: PCP - Tally Joe, MD  Cardiologist - N/A  Chest x-ray -  EKG - 06/08/23 Stress Test -  ECHO -  Cardiac Cath -  Pacemaker/ICD device last checked: Pacemaker orders received: Device Rep notified:  Spinal Cord Stimulator: N/A  Sleep Study - N/A CPAP -   Fasting Blood Sugar - N/A Checks Blood Sugar ___0__ times a day Date and result of last Hgb A1c- 5.4: 02/24/23  Last dose of GLP1 agonist- Semaglutide on hold. GLP1 instructions:   Last dose of SGLT-2 inhibitors- N/A SGLT-2 instructions:   Blood Thinner Instructions: N/A Aspirin Instructions: Last Dose:  Activity level: Can go up a flight of stairs and activities of daily living without stopping and without chest pain and/or shortness of breath   Able to exercise without chest pain and/or shortness of breath  Anesthesia review: Hx: Pre-DIA,HTN  Patient denies shortness of breath, fever, cough and chest pain at PAT appointment   Patient verbalized understanding of instructions that were given to them at the PAT appointment. Patient was also instructed that they will need to review over the PAT instructions again at home before surgery.

## 2023-06-09 ENCOUNTER — Ambulatory Visit: Payer: BC Managed Care – PPO | Admitting: Bariatrics

## 2023-06-09 DIAGNOSIS — M461 Sacroiliitis, not elsewhere classified: Secondary | ICD-10-CM | POA: Diagnosis not present

## 2023-06-14 ENCOUNTER — Other Ambulatory Visit (HOSPITAL_COMMUNITY): Payer: Self-pay

## 2023-06-14 ENCOUNTER — Ambulatory Visit: Payer: BC Managed Care – PPO | Admitting: Bariatrics

## 2023-06-14 ENCOUNTER — Encounter: Payer: Self-pay | Admitting: Bariatrics

## 2023-06-14 VITALS — BP 148/79 | HR 67 | Temp 97.7°F | Ht 68.0 in | Wt 223.0 lb

## 2023-06-14 DIAGNOSIS — I10 Essential (primary) hypertension: Secondary | ICD-10-CM

## 2023-06-14 DIAGNOSIS — R632 Polyphagia: Secondary | ICD-10-CM | POA: Diagnosis not present

## 2023-06-14 DIAGNOSIS — Z6833 Body mass index (BMI) 33.0-33.9, adult: Secondary | ICD-10-CM | POA: Diagnosis not present

## 2023-06-14 DIAGNOSIS — E669 Obesity, unspecified: Secondary | ICD-10-CM | POA: Diagnosis not present

## 2023-06-14 MED ORDER — WEGOVY 0.5 MG/0.5ML ~~LOC~~ SOAJ
0.5000 mg | SUBCUTANEOUS | 0 refills | Status: DC
  Filled 2023-06-14: qty 2, 28d supply, fill #0

## 2023-06-14 NOTE — Progress Notes (Signed)
WEIGHT SUMMARY AND BIOMETRICS  Weight Gained Since Last Visit: 4lb   Vitals Temp: 97.7 F (36.5 C) BP: (!) 148/79 Pulse Rate: 67 SpO2: 99 %   Anthropometric Measurements Height: 5\' 8"  (1.727 m) Weight: 223 lb (101.2 kg) BMI (Calculated): 33.91 Weight at Last Visit: 219lb Weight Gained Since Last Visit: 4lb Starting Weight: 244lb Total Weight Loss (lbs): 21 lb (9.526 kg)   Body Composition  Body Fat %: 43.2 % Fat Mass (lbs): 96.6 lbs Muscle Mass (lbs): 120.6 lbs Total Body Water (lbs): 94.2 lbs Visceral Fat Rating : 12   Other Clinical Data Fasting: no Labs: no Today's Visit #: 69 Starting Date: 04/22/20    OBESITY Jessica Drake is here to discuss her progress with her obesity treatment plan along with follow-up of her obesity related diagnoses.     Nutrition Plan: keeping a food journal with goal of 1200 calories and 80 grams of protein daily - 40% adherence.  Current exercise: none  Interim History:  Her weight remains the same.  Protein intake is as prescribed, Is not skipping meals, and Water intake is adequate.  Pharmacotherapy: Jessica Drake is on Wegovy 1.7 SQ weekly [name] is on hold until after she has a procedure.  Adverse side effects: None Hunger is moderately controlled.  Cravings are moderately controlled.  Assessment/Plan:   Jessica Drake endorses excessive hunger.  Medication(s): [id number] Effects of medication:  moderately controlled. Cravings are moderately controlled.   Plan: Medication(s): Wegovy 0.50 mg SQ weekly  Will start back on Wegovy at a lower dose and then taper up approximately 1 to 2 weeks after surgery or whenever her surgeon clears her.  Will increase water, protein and fiber to help assuage hunger.  Will minimize foods that have a high glucose index/load to minimize reactive hypoglycemia.    Hypertension Hypertension reasonably well controlled.  Medication(s): Cozaar 50 mg daily   BP Readings from Last 3  Encounters:  06/14/23 (!) 148/79  06/08/23 (!) 142/86  05/12/23 127/77   Lab Results  Component Value Date   CREATININE 0.92 06/08/2023   CREATININE 0.97 02/24/2023   CREATININE 0.94 03/10/2022   No results found for: "GFR"  Plan: Continue all antihypertensives at current dosages. No added salt. Will keep sodium content to 1,500 mg or less per day.     Generalized Obesity: Current BMI BMI (Calculated): 33.91   Pharmacotherapy Plan Continue and decrease dose  Wegovy 0.50 mg SQ weekly She will be off the [facility] for some time, but can resume after surgery at a lower dose and then taper up.   Aliana is currently in the action stage of change. As such, her goal is to continue with weight loss efforts.  She has agreed to keeping a food journal with goal of 1,200 calories and 80 grams of protein daily.  Exercise goals: All adults should avoid inactivity. Some physical activity is better than none, and adults who participate in any amount of physical activity gain some health benefits.  Behavioral modification strategies: no meal skipping, meal planning , increase water intake, better snacking choices, planning for success, increasing vegetables, and mindful eating.  Sharima has agreed to follow-up with our clinic in 4 weeks.     Objective:   VITALS: Per patient if applicable, see vitals. GENERAL: Alert and in no acute distress. CARDIOPULMONARY: No increased WOB. Speaking in clear sentences.  PSYCH: Pleasant and cooperative. Speech normal rate and rhythm. Affect is appropriate. Insight and judgement are appropriate. Attention is focused, linear, and  appropriate.  NEURO: Oriented as arrived to appointment on time with no prompting.   Attestation Statements:   This was prepared with the assistance of Engineer, civil (consulting).  Occasional wrong-word or sound-a-like substitutions may have occurred due to the inherent limitations of voice recognition software.   Corinna Capra, DO

## 2023-06-15 ENCOUNTER — Other Ambulatory Visit (HOSPITAL_COMMUNITY): Payer: Self-pay

## 2023-06-16 ENCOUNTER — Encounter (HOSPITAL_COMMUNITY): Payer: Self-pay | Admitting: Surgery

## 2023-06-16 DIAGNOSIS — E049 Nontoxic goiter, unspecified: Secondary | ICD-10-CM | POA: Diagnosis present

## 2023-06-16 NOTE — H&P (Signed)
PROVIDER: Tal Kempker Myra Rude, MD   Chief Complaint: Follow-up (Multinodular thyroid goiter)  History of Present Illness:  Patient return to my practice at the request of her endocrinologist, Dr. Ocie Cornfield, for consideration for thyroid resection. Patient has been followed with a multinodular thyroid goiter. She has continued to have progressive enlargement of the nodules in the right thyroid lobe. Patient underwent a follow-up ultrasound in January 2024. This demonstrated the right lobe now measuring 9.8 x 4.8 x 6.0 cm. Left lobe was relatively normal at 5.5 x 2.3 x 2.6 cm. Dominant nodules were present within the right lobe including an inferior right thyroid nodule measuring 7.1 cm in greatest size. Left thyroid lobe contained only spongiform nodules which were 1.1 cm in size or less. A biopsy of the dominant nodule on the right was attempted on November 17, 2022. Unfortunately it returned with insufficient tissue for diagnosis. Patient underwent a CT scan of the neck on March 22, 2023. This showed a large inferiorly extending right thyroid lobe exerting a mass effect on the trachea with leftward deviation and mild narrowing. TSH level remains normal at 1.68. Patient is now referred for consideration for resection. Patient has had no prior head or neck surgery. She has never been on thyroid medication. She denies any significant compressive symptoms. She is accompanied today by her husband. 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   Review of Systems: A complete review of systems was obtained from the patient. I have reviewed this information and discussed as appropriate with the patient. See HPI as well for other ROS.  Review of Systems  Constitutional: Negative.  HENT: Negative.  Eyes: Negative.  Respiratory: Negative.  Cardiovascular: Negative.  Gastrointestinal: Negative.  Genitourinary: Negative.  Musculoskeletal: Negative.  Skin: Negative.  Neurological:  Negative.  Endo/Heme/Allergies: Negative.  Psychiatric/Behavioral: Negative.    Medical History: Past Medical History:  Diagnosis Date  Hypertension   Patient Active Problem List  Diagnosis  Enlarged thyroid  Multiple thyroid nodules   History reviewed. No pertinent surgical history.   No Known Allergies  Current Outpatient Medications on File Prior to Visit  Medication Sig Dispense Refill  calcium-vits D3-C-K2-minerals 166.75 mg- 166.75 unit Cap calcium  liraglutide, weight loss, (SAXENDA) 3 mg/0.5 mL (18 mg/3 mL) pen injector Saxenda 3 mg/0.5 mL (18 mg/3 mL) subcutaneous pen injector INJECT 1.8 MG INTO THE SKIN DAILY. AND TAPER OVER TIME  losartan (COZAAR) 50 MG tablet Take by mouth  omega-3s/dha/epa/fish oil/D3 (VITAMIN-D + OMEGA-3 ORAL) Vitamin D   No current facility-administered medications on file prior to visit.   Family History  Problem Relation Age of Onset  High blood pressure (Hypertension) Father  Diabetes Father    Social History   Tobacco Use  Smoking Status Never  Smokeless Tobacco Never    Social History   Socioeconomic History  Marital status: Married  Tobacco Use  Smoking status: Never  Smokeless tobacco: Never  Vaping Use  Vaping status: Never Used  Substance and Sexual Activity  Alcohol use: Yes  Drug use: Never   Objective:   Vitals:  BP: 125/75  Pulse: 87  Temp: 36.7 C (98 F)  SpO2: 99%  Weight: (!) 102.4 kg (225 lb 12.8 oz)  PainSc: 2   Body mass index is 34.33 kg/m.  Physical Exam   GENERAL APPEARANCE Comfortable, no acute issues Development: normal Gross deformities: none  SKIN Rash, lesions, ulcers: none Induration, erythema: none Nodules: none palpable  EYES Conjunctiva and lids:  normal Pupils: equal and reactive  EARS, NOSE, MOUTH, THROAT External ears: no lesion or deformity External nose: no lesion or deformity Hearing: grossly normal  NECK Symmetric: no Trachea: deviation to left Thyroid:  There is a dominant nodule in the inferior right thyroid lobe extending into the isthmus and across the midline measuring at least 7 cm in size. There is also nodularity of the superior pole of the right thyroid lobe. Left thyroid lobe is not palpable. There is no associated lymphadenopathy.  ABDOMEN Not assessed  GENITOURINARY/RECTAL Not assessed  MUSCULOSKELETAL Station and gait: normal Digits and nails: no clubbing or cyanosis Muscle strength: grossly normal all extremities Range of motion: grossly normal all extremities Deformity: none  LYMPHATIC Cervical: none palpable Supraclavicular: none palpable  PSYCHIATRIC Oriented to person, place, and time: yes Mood and affect: normal for situation Judgment and insight: appropriate for situation   Assessment and Plan:   Enlarged thyroid Multiple thyroid nodules  The patient is referred by her endocrinologist for surgical follow-up and evaluation for thyroid surgery for relief of an enlarging thyroid goiter with tracheal deviation and tracheal compression.  Patient provided with a copy of "The Thyroid Book: Medical and Surgical Treatment of Thyroid Problems", published by [employer], 16 pages. Book reviewed and explained to patient during visit today.  Today we again reviewed her clinical history. We reviewed her ultrasound report from January as well as her CT scan results. We reviewed the actual CT scan images with the patient and her husband. We discussed options for management. We discussed total thyroidectomy versus performing a right thyroid lobectomy with preservation of the left thyroid lobe. The left lobe has small spongiform nodules which are unlikely to be of any clinical significance. We discussed the risk and benefits of the surgery including the risk of recurrent laryngeal nerve injury and injury to parathyroid glands. We discussed the size and location of the surgical incision. We discussed the hospital stay to be anticipated.  We discussed the potential need for lifelong thyroid hormone replacement. We discussed the potential need for additional surgery if we performed right thyroid lobectomy. After careful discussion and consideration, the patient and her husband would like to proceed with right thyroid lobectomy. We will plan to evaluate the left lobe of the thyroid at the time of surgery and there is the possibility of total thyroidectomy. The patient and her husband understand and wish to proceed with surgery in the near future.  Darnell Level, MD [facility] A [facility] practice Office: [phone]

## 2023-06-17 DIAGNOSIS — Z0000 Encounter for general adult medical examination without abnormal findings: Secondary | ICD-10-CM | POA: Diagnosis not present

## 2023-06-17 DIAGNOSIS — E559 Vitamin D deficiency, unspecified: Secondary | ICD-10-CM | POA: Diagnosis not present

## 2023-06-17 DIAGNOSIS — E7800 Pure hypercholesterolemia, unspecified: Secondary | ICD-10-CM | POA: Diagnosis not present

## 2023-06-17 DIAGNOSIS — I10 Essential (primary) hypertension: Secondary | ICD-10-CM | POA: Diagnosis not present

## 2023-06-18 NOTE — Progress Notes (Signed)
Anesthesia Chart Review   Case: [id number] Date/Time: 06/21/23 0715   Procedure: RIGHT THYROID LOBECTOMY (Right)   Anesthesia type: General   Pre-op diagnosis:      RIGHT THYROID NODULES     TRACHEAL DEVIATION   Location: [facility] ROOM 01 / [facility]   Surgeons: Darnell Level, MD       DISCUSSION:61 y.o. never smoker with h/o HTN, right thyroid nodule scheduled for above procedure 06/21/2023 with Dr. Darnell Level.   CT Soft Tissues Neck 03/22/2023 IMPRESSION: 1. Large inferiorly extending right thyroid nodule measuring up to 7.1 cm, previously evaluated by thyroid ultrasound and biopsy. The nodule exerts mass effect on the trachea with leftward deviation and mild narrowing. 2. Otherwise, unremarkable noncontrast CT of the neck. No pathologic lymphadenopathy.   Per Dr. Ardine Eng notes, no compressive symptoms.  VS: BP (!) 142/86   Pulse 85   Temp 36.6 C (Oral)   Ht 5\' 8"  (1.727 m)   Wt 102.5 kg   SpO2 97%   BMI 34.36 kg/m   PROVIDERS: Tally Joe, MD is PCP    LABS: Labs reviewed: Acceptable for surgery. (all labs ordered are listed, but only abnormal results are displayed)  Labs Reviewed  BASIC METABOLIC PANEL - Abnormal; Notable for the following components:      Result Value   Calcium 8.8 (*)    All other components within normal limits  CBC     IMAGES:   EKG:   CV:  Past Medical History:  Diagnosis Date   Arthritis    Back pain    High blood pressure    Pre-diabetes     Past Surgical History:  Procedure Laterality Date   COLONOSCOPY     DILATION AND CURETTAGE OF UTERUS      MEDICATIONS:  CALCIUM PO   Cholecalciferol (VITAMIN D3 PO)   losartan (COZAAR) 50 MG tablet   MAGNESIUM PO   Semaglutide-Weight Management (WEGOVY) 0.5 MG/0.5ML SOAJ   TURMERIC PO   No current facility-administered medications for this encounter.

## 2023-06-19 NOTE — Anesthesia Preprocedure Evaluation (Signed)
Anesthesia Evaluation  Patient identified by MRN, date of birth, ID band Patient awake    Reviewed: Allergy & Precautions, NPO status , Patient's Chart, lab work & pertinent test results  Airway Mallampati: III  TM Distance: >3 FB Neck ROM: Full Positive for:  Tracheal deviation   Dental no notable dental hx. (+) Dental Advisory Given, Teeth Intact   Pulmonary neg pulmonary ROS   Pulmonary exam normal        Cardiovascular hypertension, Pt. on medications  Rhythm:Regular Rate:Normal     Neuro/Psych negative neurological ROS  negative psych ROS   GI/Hepatic negative GI ROS, Neg liver ROS,,,  Endo/Other  negative endocrine ROS    Renal/GU negative Renal ROS  negative genitourinary   Musculoskeletal  (+) Arthritis , Osteoarthritis,  CT Soft Tissues Neck 03/22/2023 IMPRESSION: 1. Large inferiorly extending right thyroid nodule measuring up to 7.1 cm, previously evaluated by thyroid ultrasound and biopsy. The nodule exerts mass effect on the trachea with leftward deviation and mild narrowing. 2. Otherwise, unremarkable noncontrast CT of the neck. No pathologic lymphadenopathy.    Abdominal Normal abdominal exam  (+)   Peds  Hematology Lab Results      Component                Value               Date                      WBC                      8.8                 06/08/2023                HGB                      13.0                06/08/2023                HCT                      40.4                06/08/2023                MCV                      94.8                06/08/2023                PLT                      312                 06/08/2023             Lab Results      Component                Value               Date                      NA  139                 06/08/2023                K                        3.9                 06/08/2023                CO2                       23                  06/08/2023                GLUCOSE                  94                  06/08/2023                BUN                      20                  06/08/2023                CREATININE               0.92                06/08/2023                CALCIUM                  8.8 (L)             06/08/2023                EGFR                     66                  02/24/2023                GFRNONAA                 >60                 06/08/2023              Anesthesia Other Findings   Reproductive/Obstetrics                              Anesthesia Physical Anesthesia Plan  ASA: 3  Anesthesia Plan: General   Post-op Pain Management: Celebrex PO (pre-op)* and Tylenol PO (pre-op)*   Induction: Intravenous  PONV Risk Score and Plan: 3 and Ondansetron, Dexamethasone, Midazolam, Treatment may vary due to age or medical condition and Scopolamine patch - Pre-op  Airway Management Planned: Mask, Oral ETT and Video Laryngoscope Planned  Additional Equipment: None  Intra-op Plan:   Post-operative Plan: Extubation in OR  Informed Consent: I have reviewed the patients History and Physical, chart, labs and discussed the procedure including the risks, benefits and alternatives for the proposed anesthesia with the patient or authorized representative who has  indicated his/her understanding and acceptance.     Dental advisory given  Plan Discussed with: CRNA  Anesthesia Plan Comments:        Anesthesia Quick Evaluation

## 2023-06-21 ENCOUNTER — Ambulatory Visit (HOSPITAL_COMMUNITY): Payer: BC Managed Care – PPO | Admitting: Physician Assistant

## 2023-06-21 ENCOUNTER — Ambulatory Visit (HOSPITAL_COMMUNITY): Payer: BC Managed Care – PPO | Admitting: Anesthesiology

## 2023-06-21 ENCOUNTER — Encounter (HOSPITAL_COMMUNITY): Payer: Self-pay | Admitting: Surgery

## 2023-06-21 ENCOUNTER — Encounter (HOSPITAL_COMMUNITY)
Admission: RE | Disposition: A | Discharge status: Home / Self Care | Payer: Self-pay | Source: Home / Self Care | Attending: Surgery

## 2023-06-21 ENCOUNTER — Ambulatory Visit (HOSPITAL_COMMUNITY)
Admission: RE | Admit: 2023-06-21 | Discharge: 2023-06-22 | Disposition: A | Discharge status: Home / Self Care | Payer: BC Managed Care – PPO | Attending: Surgery | Admitting: Surgery

## 2023-06-21 ENCOUNTER — Other Ambulatory Visit: Payer: Self-pay

## 2023-06-21 DIAGNOSIS — R7303 Prediabetes: Secondary | ICD-10-CM | POA: Diagnosis not present

## 2023-06-21 DIAGNOSIS — E042 Nontoxic multinodular goiter: Secondary | ICD-10-CM | POA: Diagnosis present

## 2023-06-21 DIAGNOSIS — E049 Nontoxic goiter, unspecified: Secondary | ICD-10-CM | POA: Diagnosis present

## 2023-06-21 DIAGNOSIS — I10 Essential (primary) hypertension: Secondary | ICD-10-CM | POA: Insufficient documentation

## 2023-06-21 DIAGNOSIS — E041 Nontoxic single thyroid nodule: Secondary | ICD-10-CM | POA: Diagnosis not present

## 2023-06-21 DIAGNOSIS — D34 Benign neoplasm of thyroid gland: Secondary | ICD-10-CM | POA: Diagnosis not present

## 2023-06-21 HISTORY — PX: THYROID LOBECTOMY: SHX420

## 2023-06-21 SURGERY — LOBECTOMY, THYROID
Anesthesia: General | Laterality: Right

## 2023-06-21 MED ORDER — ACETAMINOPHEN 325 MG PO TABS
650.0000 mg | ORAL_TABLET | Freq: Four times a day (QID) | ORAL | Status: DC | PRN
  Administered 2023-06-21: 650 mg via ORAL
  Filled 2023-06-21: qty 2

## 2023-06-21 MED ORDER — FENTANYL CITRATE PF 50 MCG/ML IJ SOSY
PREFILLED_SYRINGE | INTRAMUSCULAR | Status: AC
  Filled 2023-06-21: qty 1

## 2023-06-21 MED ORDER — ONDANSETRON HCL 4 MG/2ML IJ SOLN: 4.0000 mg | Freq: Four times a day (QID) | INTRAMUSCULAR | Status: DC | PRN

## 2023-06-21 MED ORDER — 0.9 % SODIUM CHLORIDE (POUR BTL) OPTIME
TOPICAL | Status: DC | PRN
  Administered 2023-06-21: 1000 mL

## 2023-06-21 MED ORDER — FENTANYL CITRATE (PF) 250 MCG/5ML IJ SOLN
INTRAMUSCULAR | Status: AC
  Filled 2023-06-21: qty 5

## 2023-06-21 MED ORDER — DROPERIDOL 2.5 MG/ML IJ SOLN
0.6250 mg | Freq: Once | INTRAMUSCULAR | Status: AC | PRN
  Administered 2023-06-21: 0.625 mg via INTRAVENOUS

## 2023-06-21 MED ORDER — SODIUM CHLORIDE 0.45 % IV SOLN: INTRAVENOUS | Status: DC

## 2023-06-21 MED ORDER — LACTATED RINGERS IV SOLN: INTRAVENOUS | Status: DC

## 2023-06-21 MED ORDER — DROPERIDOL 2.5 MG/ML IJ SOLN
INTRAMUSCULAR | Status: AC
  Filled 2023-06-21: qty 2

## 2023-06-21 MED ORDER — CEFAZOLIN SODIUM-DEXTROSE 2-4 GM/100ML-% IV SOLN
2.0000 g | INTRAVENOUS | Status: AC
  Administered 2023-06-21: 2 g via INTRAVENOUS
  Filled 2023-06-21: qty 100

## 2023-06-21 MED ORDER — HEMOSTATIC AGENTS (NO CHARGE) OPTIME
TOPICAL | Status: DC | PRN
  Administered 2023-06-21: 1

## 2023-06-21 MED ORDER — ROCURONIUM BROMIDE 10 MG/ML (PF) SYRINGE
PREFILLED_SYRINGE | INTRAVENOUS | Status: DC | PRN
  Administered 2023-06-21: 60 mg via INTRAVENOUS
  Administered 2023-06-21: 20 mg via INTRAVENOUS

## 2023-06-21 MED ORDER — ACETAMINOPHEN 650 MG RE SUPP: 650.0000 mg | Freq: Four times a day (QID) | RECTAL | Status: DC | PRN

## 2023-06-21 MED ORDER — CHLORHEXIDINE GLUCONATE CLOTH 2 % EX PADS: 6.0000 | MEDICATED_PAD | Freq: Once | CUTANEOUS | Status: DC

## 2023-06-21 MED ORDER — PROPOFOL 10 MG/ML IV BOLUS
INTRAVENOUS | Status: AC
  Filled 2023-06-21: qty 20

## 2023-06-21 MED ORDER — LIDOCAINE HCL (PF) 2 % IJ SOLN
INTRAMUSCULAR | Status: AC
  Filled 2023-06-21: qty 5

## 2023-06-21 MED ORDER — ACETAMINOPHEN 500 MG PO TABS
1000.0000 mg | ORAL_TABLET | Freq: Once | ORAL | Status: AC
  Administered 2023-06-21: 1000 mg via ORAL
  Filled 2023-06-21: qty 2

## 2023-06-21 MED ORDER — SUGAMMADEX SODIUM 200 MG/2ML IV SOLN
INTRAVENOUS | Status: DC | PRN
  Administered 2023-06-21: 200 mg via INTRAVENOUS

## 2023-06-21 MED ORDER — SCOPOLAMINE 1 MG/3DAYS TD PT72
MEDICATED_PATCH | TRANSDERMAL | Status: AC
  Administered 2023-06-21: 1.5 mg via TRANSDERMAL
  Filled 2023-06-21: qty 1

## 2023-06-21 MED ORDER — ONDANSETRON HCL 4 MG/2ML IJ SOLN
INTRAMUSCULAR | Status: DC | PRN
Start: 2023-06-21 — End: 2023-06-21
  Administered 2023-06-21: 4 mg via INTRAVENOUS

## 2023-06-21 MED ORDER — ONDANSETRON 4 MG PO TBDP: 4.0000 mg | ORAL_TABLET | Freq: Four times a day (QID) | ORAL | Status: DC | PRN

## 2023-06-21 MED ORDER — OXYCODONE HCL 5 MG PO TABS: 5.0000 mg | ORAL_TABLET | ORAL | Status: DC | PRN

## 2023-06-21 MED ORDER — ONDANSETRON HCL 4 MG/2ML IJ SOLN
INTRAMUSCULAR | Status: AC
  Filled 2023-06-21: qty 2

## 2023-06-21 MED ORDER — DEXAMETHASONE SODIUM PHOSPHATE 10 MG/ML IJ SOLN
INTRAMUSCULAR | Status: DC | PRN
  Administered 2023-06-21: 10 mg via INTRAVENOUS

## 2023-06-21 MED ORDER — DEXAMETHASONE SODIUM PHOSPHATE 10 MG/ML IJ SOLN
INTRAMUSCULAR | Status: AC
  Filled 2023-06-21: qty 1

## 2023-06-21 MED ORDER — LOSARTAN POTASSIUM 50 MG PO TABS
50.0000 mg | ORAL_TABLET | Freq: Every day | ORAL | Status: DC
  Administered 2023-06-21 – 2023-06-22 (×2): 50 mg via ORAL
  Filled 2023-06-21 (×2): qty 1

## 2023-06-21 MED ORDER — MIDAZOLAM HCL 2 MG/2ML IJ SOLN
INTRAMUSCULAR | Status: AC
  Filled 2023-06-21: qty 2

## 2023-06-21 MED ORDER — MIDAZOLAM HCL 5 MG/5ML IJ SOLN
INTRAMUSCULAR | Status: DC | PRN
  Administered 2023-06-21: 2 mg via INTRAVENOUS

## 2023-06-21 MED ORDER — LIDOCAINE 2% (20 MG/ML) 5 ML SYRINGE
INTRAMUSCULAR | Status: DC | PRN
  Administered 2023-06-21: 60 mg via INTRAVENOUS
  Administered 2023-06-21: 40 mg via INTRAVENOUS

## 2023-06-21 MED ORDER — FENTANYL CITRATE PF 50 MCG/ML IJ SOSY
PREFILLED_SYRINGE | INTRAMUSCULAR | Status: AC
  Administered 2023-06-21: 50 ug via INTRAVENOUS
  Filled 2023-06-21: qty 2

## 2023-06-21 MED ORDER — FENTANYL CITRATE PF 50 MCG/ML IJ SOSY
25.0000 ug | PREFILLED_SYRINGE | INTRAMUSCULAR | Status: DC | PRN
  Administered 2023-06-21 (×2): 50 ug via INTRAVENOUS

## 2023-06-21 MED ORDER — CHLORHEXIDINE GLUCONATE 0.12 % MT SOLN
15.0000 mL | Freq: Once | OROMUCOSAL | Status: AC
  Administered 2023-06-21: 15 mL via OROMUCOSAL

## 2023-06-21 MED ORDER — HYDROMORPHONE HCL 1 MG/ML IJ SOLN: 1.0000 mg | INTRAMUSCULAR | Status: DC | PRN

## 2023-06-21 MED ORDER — FENTANYL CITRATE (PF) 250 MCG/5ML IJ SOLN
INTRAMUSCULAR | Status: DC | PRN
  Administered 2023-06-21 (×2): 50 ug via INTRAVENOUS
  Administered 2023-06-21: 100 ug via INTRAVENOUS
  Administered 2023-06-21: 50 ug via INTRAVENOUS

## 2023-06-21 MED ORDER — PROPOFOL 10 MG/ML IV BOLUS
INTRAVENOUS | Status: DC | PRN
  Administered 2023-06-21: 20 mg via INTRAVENOUS
  Administered 2023-06-21: 180 mg via INTRAVENOUS
  Administered 2023-06-21: 20 mg via INTRAVENOUS

## 2023-06-21 MED ORDER — ROCURONIUM BROMIDE 10 MG/ML (PF) SYRINGE
PREFILLED_SYRINGE | INTRAVENOUS | Status: AC
  Filled 2023-06-21: qty 10

## 2023-06-21 MED ORDER — SCOPOLAMINE 1 MG/3DAYS TD PT72: 1.0000 | MEDICATED_PATCH | TRANSDERMAL | Status: DC

## 2023-06-21 MED ORDER — CELECOXIB 200 MG PO CAPS
200.0000 mg | ORAL_CAPSULE | Freq: Once | ORAL | Status: AC
  Administered 2023-06-21: 200 mg via ORAL
  Filled 2023-06-21: qty 1

## 2023-06-21 MED ORDER — ORAL CARE MOUTH RINSE: 15.0000 mL | Freq: Once | OROMUCOSAL | Status: AC

## 2023-06-21 MED ORDER — TRAMADOL HCL 50 MG PO TABS: 50.0000 mg | ORAL_TABLET | Freq: Four times a day (QID) | ORAL | Status: DC | PRN

## 2023-06-21 SURGICAL SUPPLY — 33 items
ADH SKN CLS APL DERMABOND .7 (GAUZE/BANDAGES/DRESSINGS) ×1
APL PRP STRL LF DISP 70% ISPRP (MISCELLANEOUS) ×1
ATTRACTOMAT 16X20 MAGNETIC DRP (DRAPES) ×2 IMPLANT
BAG COUNTER SPONGE SURGICOUNT (BAG) ×2 IMPLANT
BAG SPNG CNTER NS LX DISP (BAG) ×1
BLADE SURG 15 STRL LF DISP TIS (BLADE) ×2 IMPLANT
BLADE SURG 15 STRL SS (BLADE) ×1
CHLORAPREP W/TINT 26 (MISCELLANEOUS) ×2 IMPLANT
CLIP TI MEDIUM 6 (CLIP) ×4 IMPLANT
CLIP TI WIDE RED SMALL 6 (CLIP) ×4 IMPLANT
COVER SURGICAL LIGHT HANDLE (MISCELLANEOUS) ×2 IMPLANT
DERMABOND ADVANCED .7 DNX12 (GAUZE/BANDAGES/DRESSINGS) ×2 IMPLANT
DRAPE LAPAROTOMY T 98X78 PEDS (DRAPES) ×2 IMPLANT
DRAPE UTILITY XL STRL (DRAPES) ×2 IMPLANT
ELECT PENCIL ROCKER SW 15FT (MISCELLANEOUS) ×2 IMPLANT
ELECT REM PT RETURN 15FT ADLT (MISCELLANEOUS) ×2 IMPLANT
GAUZE 4X4 16PLY ~~LOC~~+RFID DBL (SPONGE) ×2 IMPLANT
GLOVE SURG ORTHO 8.0 STRL STRW (GLOVE) ×2 IMPLANT
GOWN STRL REUS W/ TWL XL LVL3 (GOWN DISPOSABLE) ×4 IMPLANT
GOWN STRL REUS W/TWL XL LVL3 (GOWN DISPOSABLE) ×2
HEMOSTAT SURGICEL 2X4 FIBR (HEMOSTASIS) ×2 IMPLANT
ILLUMINATOR WAVEGUIDE N/F (MISCELLANEOUS) ×2 IMPLANT
KIT BASIN OR (CUSTOM PROCEDURE TRAY) ×2 IMPLANT
KIT TURNOVER KIT A (KITS) IMPLANT
PACK BASIC VI WITH GOWN DISP (CUSTOM PROCEDURE TRAY) ×2 IMPLANT
SHEARS HARMONIC 9CM CVD (BLADE) ×2 IMPLANT
SUT MNCRL AB 4-0 PS2 18 (SUTURE) ×2 IMPLANT
SUT SILK 3 0 SH 30 (SUTURE) ×2 IMPLANT
SUT VIC AB 3-0 SH 18 (SUTURE) ×4 IMPLANT
SYR BULB IRRIG 60ML STRL (SYRINGE) ×2 IMPLANT
TOWEL OR 17X26 10 PK STRL BLUE (TOWEL DISPOSABLE) ×2 IMPLANT
TOWEL OR NON WOVEN STRL DISP B (DISPOSABLE) ×2 IMPLANT
TUBING CONNECTING 10 (TUBING) ×2 IMPLANT

## 2023-06-21 NOTE — Interval H&P Note (Signed)
History and Physical Interval Note:  06/21/2023 7:00 AM  Jessica Drake  has presented today for surgery, with the diagnosis of RIGHT THYROID NODULES TRACHEAL DEVIATION.  The various methods of treatment have been discussed with the patient and family. After consideration of risks, benefits and other options for treatment, the patient has consented to    Procedure(s): RIGHT THYROID LOBECTOMY (Right) as a surgical intervention.    The patient's history has been reviewed, patient examined, no change in status, stable for surgery.  I have reviewed the patient's chart and labs.  Questions were answered to the patient's satisfaction.    Darnell Level, MD [facility] practice Office: [phone]   Darnell Level

## 2023-06-21 NOTE — Transfer of Care (Signed)
Immediate Anesthesia Transfer of Care Note  Patient: Jessica Drake  Procedure(s) Performed: RIGHT THYROID LOBECTOMY (Right)  Patient Location: PACU  Anesthesia Type:General  Level of Consciousness: oriented, drowsy, and patient cooperative  Airway & Oxygen Therapy: Patient Spontanous Breathing and Patient connected to face mask oxygen  Post-op Assessment: Report given to RN and Post -op Vital signs reviewed and stable  Post vital signs: Reviewed  Last Vitals:  Vitals Value Taken Time  BP 149/94 06/21/23 0953  Temp    Pulse 84 06/21/23 0957  Resp 16 06/21/23 0957  SpO2 100 % 06/21/23 0957  Vitals shown include unfiled device data.  Last Pain:  Vitals:   06/21/23 0547  TempSrc: Oral  PainSc: 0-No pain      Patients Stated Pain Goal: 5 (06/21/23 0547)  Complications:  Encounter Notable Events  Notable Event Outcome Phase Comment  Difficult to intubate - expected  Intraprocedure Filed from anesthesia note documentation.

## 2023-06-21 NOTE — Op Note (Signed)
Procedure Note  Pre-operative Diagnosis:  Right thyroid nodule  Post-operative Diagnosis:  same  Surgeon:  Darnell Level, MD  Assistant:  Saunders Glance, PA-C   Procedure:  Right thyroid lobectomy and isthmusectomy  Anesthesia:  General  Estimated Blood Loss:  100 cc  Drains: none         Specimen: thyroid lobe to pathology  Indications:  Patient returns to my practice at the request of her endocrinologist, Dr. Ocie Cornfield, for consideration for thyroid resection. Patient has been followed with a multinodular thyroid goiter. She has continued to have progressive enlargement of the nodules in the right thyroid lobe. Patient underwent a follow-up ultrasound in January 2024. This demonstrated the right lobe now measuring 9.8 x 4.8 x 6.0 cm. Left lobe was relatively normal at 5.5 x 2.3 x 2.6 cm. Dominant nodules were present within the right lobe including an inferior right thyroid nodule measuring 7.1 cm in greatest size. Left thyroid lobe contained only spongiform nodules which were 1.1 cm in size or less. A biopsy of the dominant nodule on the right was attempted on November 17, 2022. Unfortunately it returned with insufficient tissue for diagnosis. Patient underwent a CT scan of the neck on March 22, 2023. This showed a large inferiorly extending right thyroid lobe exerting a mass effect on the trachea with leftward deviation and mild narrowing. TSH level remains normal at 1.68. Patient is now referred for consideration for resection.   Procedure Details: Procedure was done in OR #1 at the [facility]. The patient was brought to the operating room and placed in a supine position on the operating room table. Following administration of general anesthesia, the patient was positioned and then prepped and draped in the usual aseptic fashion. After ascertaining that an adequate level of anesthesia had been achieved, a small Kocher incision was made with #15 blade. Dissection was carried through  subcutaneous tissues and platysma. Hemostasis was achieved with the electrocautery. Skin flaps were elevated cephalad and caudad from the thyroid notch to the sternal notch. A self-retaining retractor was placed for exposure. Strap muscles were incised in the midline and dissection was begun on the right side. Strap muscles were reflected laterally. The right thyroid lobe and isthmus was markedly enlarged with a dominant mass occupying the mid and inferior portions of the lobe and extending into the isthmus.  This caused tracheal deviation to the left. The lobe was gently mobilized with blunt dissection. Superior pole vessels were dissected out and divided individually between small and medium ligaclips with the harmonic scalpel. The thyroid lobe was rolled anteriorly. Branches of the inferior thyroid artery were divided between small ligaclips with the harmonic scalpel. Inferior venous tributaries were divided between ligaclips. Both the superior and inferior parathyroid glands were identified and preserved on their vascular pedicles. The recurrent laryngeal nerve was identified and preserved along its course. The ligament of Allyson Sabal was released with the electrocautery and the gland was mobilized onto the anterior trachea. Isthmus was mobilized across the midline. There was no significant pyramidal lobe present. The thyroid parenchyma was transected at the junction of the isthmus and contralateral thyroid lobe with the harmonic scalpel. A suture was used to mark the isthmus margin. The thyroid lobe and isthmus were submitted to pathology for review.  The entire field was palpated for evidence of lymphadenopathy or extra-thyroidal disease.  No worrisome findings were noted.  No enlarged lymph nodes were identified.  The neck was irrigated with warm saline. Fibrillar was placed throughout  the operative field. Strap muscles were approximated in the midline with interrupted 3-0 Vicryl sutures. Platysma was closed  with interrupted 3-0 Vicryl sutures. Skin was closed with a running 4-0 Monocryl subcuticular suture.  Wound was washed and dried and Dermabond was applied. The patient was awakened from anesthesia and brought to the recovery room. The patient tolerated the procedure well.   Darnell Level, MD [facility] Office: [phone]

## 2023-06-21 NOTE — Anesthesia Procedure Notes (Signed)
Procedure Name: Intubation Date/Time: 06/21/2023 7:56 AM  Performed by: Lovie Chol, CRNAPre-anesthesia Checklist: Patient identified, Emergency Drugs available, Suction available and Patient being monitored Patient Re-evaluated:Patient Re-evaluated prior to induction Oxygen Delivery Method: Circle system utilized Preoxygenation: Pre-oxygenation with 100% oxygen Induction Type: IV induction Ventilation: Mask ventilation without difficulty, Two handed mask ventilation required and Oral airway inserted - appropriate to patient size Laryngoscope Size: Glidescope and 3 Grade View: Grade I Tube type: Oral Tube size: 7.0 mm Number of attempts: 1 Airway Equipment and Method: Stylet, Oral airway and Video-laryngoscopy Placement Confirmation: ETT inserted through vocal cords under direct vision, positive ETCO2 and breath sounds checked- equal and bilateral Secured at: 22 cm Tube secured with: Tape Dental Injury: Teeth and Oropharynx as per pre-operative assessment  Difficulty Due To: Difficulty was anticipated

## 2023-06-22 ENCOUNTER — Encounter (HOSPITAL_COMMUNITY): Payer: Self-pay | Admitting: Surgery

## 2023-06-22 DIAGNOSIS — E042 Nontoxic multinodular goiter: Secondary | ICD-10-CM | POA: Diagnosis not present

## 2023-06-22 DIAGNOSIS — R7303 Prediabetes: Secondary | ICD-10-CM | POA: Diagnosis not present

## 2023-06-22 DIAGNOSIS — I10 Essential (primary) hypertension: Secondary | ICD-10-CM | POA: Diagnosis not present

## 2023-06-22 DIAGNOSIS — E041 Nontoxic single thyroid nodule: Secondary | ICD-10-CM | POA: Diagnosis not present

## 2023-06-22 LAB — SURGICAL PATHOLOGY

## 2023-06-22 NOTE — Anesthesia Postprocedure Evaluation (Signed)
Anesthesia Post Note  Patient: SRIYA VANDEGRIFT  Procedure(s) Performed: RIGHT THYROID LOBECTOMY (Right)     Patient location during evaluation: PACU Anesthesia Type: General Level of consciousness: awake and alert Pain management: pain level controlled Vital Signs Assessment: post-procedure vital signs reviewed and stable Respiratory status: spontaneous breathing, nonlabored ventilation, respiratory function stable and patient connected to nasal cannula oxygen Cardiovascular status: blood pressure returned to baseline and stable Postop Assessment: no apparent nausea or vomiting Anesthetic complications: yes   Encounter Notable Events  Notable Event Outcome Phase Comment  Difficult to intubate - expected  Intraprocedure Filed from anesthesia note documentation.    Last Vitals:  Vitals:   06/22/23 0108 06/22/23 0513  BP: 132/83 132/84  Pulse: 70 71  Resp: 17 17  Temp: 36.8 C 36.8 C  SpO2: 99% 99%    Last Pain:  Vitals:   06/22/23 0800  TempSrc:   PainSc: 0-No pain                 Earl Lites P Cailin Gebel

## 2023-06-22 NOTE — Progress Notes (Signed)
Discharge instructions discussed with patient and family, verbalized agreement and understanding

## 2023-06-22 NOTE — Discharge Instructions (Signed)
[facility] - Dr. Quillan Whitter  THYROID & PARATHYROID SURGERY:  POST-OP INSTRUCTIONS  Always review the instruction sheet provided by the hospital nurse at discharge.  A prescription for pain medication may be sent to your pharmacy at the time of discharge.  Take your pain medication as prescribed.  If narcotic pain medicine is not needed, then you may take acetaminophen (Tylenol) or ibuprofen (Advil) as needed for pain or soreness.  Take your normal home medications as prescribed unless otherwise directed.  If you need a refill on your pain medication, please contact the office during regular business hours.  Prescriptions will not be processed by the office after 5:00PM or on weekends.  Start with a light diet upon arrival home, such as soup and crackers or toast.  Be sure to drink plenty of fluids.  Resume your normal diet the day after surgery.  Most patients will experience some swelling and bruising on the chest and neck area.  Ice packs will help for the first 48 hours after arriving home.  Swelling and bruising will take several days to resolve.   It is common to experience some constipation after surgery.  Increasing fluid intake and taking a stool softener (Colace) will usually help to prevent this problem.  A mild laxative (Milk of Magnesia or Miralax) should be taken according to package directions if there has been no bowel movement after 48 hours.  Dermabond glue covers your incision. This seals the wound and you may shower at any time. The Dermabond will remain in place for about a week.  You may gradually remove the glue when it loosens around the edges.  If you need to loosen the Dermabond for removal, apply a layer of Vaseline to the wound for 15 minutes and then remove with a Kleenex. Your sutures are under the skin and will not show - they will dissolve on their own.  You may resume light daily activities beginning the day after discharge (such as self-care,  walking, climbing stairs), gradually increasing activities as tolerated. You may have sexual intercourse when it is comfortable. Refrain from any heavy lifting or straining until approved by your doctor. You may drive when you no longer are taking prescription pain medication, you can comfortably wear a seatbelt, and you can safely maneuver your car and apply the brakes.  You will see your doctor in the office for a follow-up appointment approximately three weeks after your surgery.  Make sure that you call for this appointment within a day or two after you arrive home to insure a convenient appointment time. Please have any requested laboratory tests performed a few days prior to your office visit so that the results will be available at your follow up appointment.  WHEN TO CALL THE [facility] OFFICE: -- Fever greater than 101.5 -- Inability to urinate -- Nausea and/or vomiting - persistent -- Extreme swelling or bruising -- Continued bleeding from incision -- Increased pain, redness, or drainage from the incision -- Difficulty swallowing or breathing -- Muscle cramping or spasms -- Numbness or tingling in hands or around lips  The clinic staff is available to answer your questions during regular business hours.  Please don't hesitate to call and ask to speak to one of the nurses if you have concerns.  [facility] OFFICE: [phone] (24 hours)  Please sign up for MyChart accounts. This will allow you to communicate directly with my nurse or myself without having to call the office. It will also allow you   to view your test results. You will need to enroll in MyChart for my office ([facility]) and for the hospital ([facility]).  Denya Buckingham, MD [facility] A [facility] practice 

## 2023-06-22 NOTE — Discharge Summary (Signed)
Physician Discharge Summary   Patient ID: Jessica Drake MRN: [id number] DOB/AGE: Apr 18, 1961 62 y.o.  Admit date: 06/21/2023  Discharge date: 06/22/2023  Discharge Diagnoses:  Principal Problem:   Enlarged thyroid gland Active Problems:   Multiple thyroid nodules   Thyroid nodule   Discharged Condition: good  Hospital Course: Patient was admitted for observation following thyroid surgery.  Post op course was uncomplicated.  Pain was well controlled.  Tolerated diet.  Patient was prepared for discharge home on POD#1.  Consults: None  Treatments: surgery: right thyroid lobectomy  Discharge Exam: Blood pressure 132/84, pulse 71, temperature 98.3 F (36.8 C), temperature source Oral, resp. rate 17, height 5\' 8"  (1.727 m), weight 101.2 kg, SpO2 99%. HEENT - clear Neck - wound dry and intact; mild STS; voice normal; Dermabond in place  Disposition: Home  Discharge Instructions     Diet - low sodium heart healthy   Complete by: As directed    Increase activity slowly   Complete by: As directed    No dressing needed   Complete by: As directed       Allergies as of 06/22/2023   Not on File      Medication List     TAKE these medications    CALCIUM PO Take 1 tablet by mouth daily.   losartan 50 MG tablet Commonly known as: COZAAR Take 50 mg by mouth daily.   MAGNESIUM PO Take 1 tablet by mouth daily.   TURMERIC PO Take 1 capsule by mouth daily.   VITAMIN D3 PO Take 1 tablet by mouth daily.   Wegovy 0.5 MG/0.5ML Soaj Generic drug: Semaglutide-Weight Management Inject 0.5 mg into the skin once a week.               Discharge Care Instructions  (From admission, onward)           Start     Ordered   06/22/23 0000  No dressing needed        06/22/23 2130            Follow-up Information     Darnell Level, MD. Schedule an appointment as soon as possible for a visit in 3 week(s).   Specialty: General Surgery Why: For wound  re-check Contact information: [address] [place] [address] [phone]                 Darnell Level, MD [facility] Office: [phone]   Signed: Darnell Level 06/22/2023, 8:47 AM

## 2023-06-23 NOTE — Progress Notes (Signed)
Pathology is all benign, as expected.  Darnell Level, MD [facility] practice Office: [phone]

## 2023-07-01 NOTE — Progress Notes (Signed)
I received this message:  Dr. Gerrit Friends,   Goiter is documented in the pathology note dated 06/21/23. Per Official Coding Guidelines for Outpatient, "consistent with" in documentation on pathology reports is not allowed as a reportable diagnosis. Please further clarify the pathology findings.   Please exercise your independent, professional judgment when responding.  A specific answer is not anticipated or expected.     Thank You,  Wilhemena Durie [facility] [employer] [facility] [place] [phone]  Response:  A. 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   As I've stated previously, theses queries should be addressed to the pathologist and not to the surgeon.  Darnell Level, MD [facility] practice Office: [phone]

## 2023-07-12 ENCOUNTER — Ambulatory Visit: Payer: BC Managed Care – PPO | Admitting: Bariatrics

## 2023-07-13 DIAGNOSIS — E559 Vitamin D deficiency, unspecified: Secondary | ICD-10-CM | POA: Diagnosis not present

## 2023-07-13 DIAGNOSIS — E890 Postprocedural hypothyroidism: Secondary | ICD-10-CM | POA: Diagnosis not present

## 2023-07-13 DIAGNOSIS — I10 Essential (primary) hypertension: Secondary | ICD-10-CM | POA: Diagnosis not present

## 2023-07-19 DIAGNOSIS — Z23 Encounter for immunization: Secondary | ICD-10-CM | POA: Diagnosis not present

## 2023-07-22 ENCOUNTER — Ambulatory Visit: Payer: BC Managed Care – PPO | Admitting: Bariatrics

## 2023-07-22 ENCOUNTER — Other Ambulatory Visit (HOSPITAL_COMMUNITY): Payer: Self-pay

## 2023-07-22 VITALS — BP 129/75 | HR 68 | Temp 97.9°F | Ht 68.0 in | Wt 225.0 lb

## 2023-07-22 DIAGNOSIS — E669 Obesity, unspecified: Secondary | ICD-10-CM

## 2023-07-22 DIAGNOSIS — E6609 Other obesity due to excess calories: Secondary | ICD-10-CM

## 2023-07-22 DIAGNOSIS — R632 Polyphagia: Secondary | ICD-10-CM | POA: Diagnosis not present

## 2023-07-22 DIAGNOSIS — I10 Essential (primary) hypertension: Secondary | ICD-10-CM | POA: Diagnosis not present

## 2023-07-22 DIAGNOSIS — Z6834 Body mass index (BMI) 34.0-34.9, adult: Secondary | ICD-10-CM

## 2023-07-22 MED ORDER — WEGOVY 0.5 MG/0.5ML ~~LOC~~ SOAJ
0.5000 mg | SUBCUTANEOUS | 0 refills | Status: DC
  Filled 2023-07-22: qty 2, 28d supply, fill #0

## 2023-07-22 NOTE — Progress Notes (Signed)
WEIGHT SUMMARY AND BIOMETRICS  Weight Lost Since Last Visit: 0  Weight Gained Since Last Visit: 2lb   Vitals Temp: 97.9 F (36.6 C) BP: 129/75 Pulse Rate: 68 SpO2: 99 %   Anthropometric Measurements Height: 5\' 8"  (1.727 m) Weight: 225 lb (102.1 kg) BMI (Calculated): 34.22 Weight at Last Visit: 223lb Weight Lost Since Last Visit: 0 Weight Gained Since Last Visit: 2lb Starting Weight: 244lb Total Weight Loss (lbs): 19 lb (8.618 kg)   Body Composition  Body Fat %: 43 % Fat Mass (lbs): 97 lbs Muscle Mass (lbs): 122 lbs Total Body Water (lbs): 93.4 lbs Visceral Fat Rating : 12   Other Clinical Data Fasting: no Labs: no Today's Visit #: 45 Starting Date: 04/22/20    OBESITY Jessica Drake is here to discuss her progress with her obesity treatment plan along with follow-up of her obesity related diagnoses.     Nutrition Plan: keeping a food journal with goal of 1200 calories and 80 grams of protein daily - 40% adherence.  Current exercise: none  Interim History:  She is up 2 lbs since her last visit.  Eating all of the food on the plan., Protein intake is as prescribed, Water intake is adequate., Denies polyphagia, and Denies excessive cravings.  Pharmacotherapy: Niara is on Wegovy 0.50 mg SQ weekly Adverse side effects: None Hunger is moderately controlled. Her appetite is normal.  Cravings are moderately controlled. Her craving are normal.  Assessment/Plan:   Lonell Face endorses excessive hunger.  Medication(s): [id number] Effects of medication:  moderately controlled. Cravings are moderately controlled.   Plan: Medication(s): Wegovy 0.50 mg SQ weekly Will increase water, protein and fiber to help assuage hunger.  Will minimize foods that have a high glucose index/load to minimize reactive hypoglycemia.   Hypertension Hypertension stable.  Medication(s): Cozaar 50 mg daily   BP Readings from Last 3 Encounters:  07/22/23 129/75   06/22/23 132/84  06/14/23 (!) 148/79   Lab Results  Component Value Date   CREATININE 0.92 06/08/2023   CREATININE 0.97 02/24/2023   CREATININE 0.94 03/10/2022   No results found for: "GFR"  Plan: Continue all antihypertensives at current dosages. No added salt. Will keep sodium content to 1,500 mg or less per day.      Generalized Obesity: Current BMI BMI (Calculated): 34.22   Pharmacotherapy Plan Continue and refill  Wegovy 0.50 mg SQ weekly  [name] is currently in the action stage of change. As such, her goal is to continue with weight loss efforts.  She has agreed to keeping a food journal with goal of 1,200 calories and 80 grams of protein daily.  Exercise goals: For additional and more extensive health benefits, adults should increase their aerobic physical activity to 300 minutes (5 hours) a week of moderate-intensity, or 150 minutes a week of vigorous-intensity aerobic physical activity, or an equivalent combination of moderate- and vigorous-intensity activity. Additional health benefits are gained by engaging in physical activity beyond this amount.   Behavioral modification strategies: increasing lean protein intake, increase water intake, increasing vegetables, increasing fiber rich foods, and mindful eating.  Carlean has agreed to follow-up with our clinic in 4 weeks.       Objective:   VITALS: Per patient if applicable, see vitals. GENERAL: Alert and in no acute distress. CARDIOPULMONARY: No increased WOB. Speaking in clear sentences.  PSYCH: Pleasant and cooperative. Speech normal rate and rhythm. Affect is appropriate. Insight and judgement are appropriate. Attention is focused, linear, and appropriate.  NEURO: Oriented as  arrived to appointment on time with no prompting.   Attestation Statements:   This was prepared with the assistance of Engineer, civil (consulting).  Occasional wrong-word or sound-a-like substitutions may have occurred due to the inherent  limitations of voice recognition software.   Corinna Capra, DO

## 2023-07-23 ENCOUNTER — Other Ambulatory Visit (HOSPITAL_COMMUNITY): Payer: Self-pay

## 2023-07-26 ENCOUNTER — Other Ambulatory Visit (HOSPITAL_COMMUNITY): Payer: Self-pay

## 2023-08-19 ENCOUNTER — Other Ambulatory Visit (HOSPITAL_COMMUNITY): Payer: Self-pay

## 2023-08-19 ENCOUNTER — Ambulatory Visit: Payer: BC Managed Care – PPO | Admitting: Bariatrics

## 2023-08-19 ENCOUNTER — Other Ambulatory Visit: Payer: Self-pay

## 2023-08-19 ENCOUNTER — Encounter: Payer: Self-pay | Admitting: Bariatrics

## 2023-08-19 VITALS — BP 125/70 | HR 73 | Temp 97.9°F | Ht 68.0 in | Wt 221.0 lb

## 2023-08-19 DIAGNOSIS — E6609 Other obesity due to excess calories: Secondary | ICD-10-CM

## 2023-08-19 DIAGNOSIS — R7303 Prediabetes: Secondary | ICD-10-CM

## 2023-08-19 DIAGNOSIS — E669 Obesity, unspecified: Secondary | ICD-10-CM

## 2023-08-19 DIAGNOSIS — Z6833 Body mass index (BMI) 33.0-33.9, adult: Secondary | ICD-10-CM | POA: Diagnosis not present

## 2023-08-19 DIAGNOSIS — R632 Polyphagia: Secondary | ICD-10-CM

## 2023-08-19 MED ORDER — WEGOVY 1 MG/0.5ML ~~LOC~~ SOAJ
1.0000 mg | SUBCUTANEOUS | 0 refills | Status: DC
  Filled 2023-08-19: qty 2, 28d supply, fill #0

## 2023-08-19 MED ORDER — WEGOVY 0.5 MG/0.5ML ~~LOC~~ SOAJ
0.5000 mg | SUBCUTANEOUS | 0 refills | Status: DC
  Filled 2023-08-19: qty 2, 28d supply, fill #0

## 2023-08-19 NOTE — Progress Notes (Signed)
WEIGHT SUMMARY AND BIOMETRICS  Weight Lost Since Last Visit: 4lb  Weight Gained Since Last Visit: 0   Vitals Temp: 97.9 F (36.6 C) BP: 125/70 Pulse Rate: 73 SpO2: 98 %   Anthropometric Measurements Height: 5\' 8"  (1.727 m) Weight: 221 lb (100.2 kg) BMI (Calculated): 33.61 Weight at Last Visit: 225lb Weight Lost Since Last Visit: 4lb Weight Gained Since Last Visit: 0 Starting Weight: 244lb Total Weight Loss (lbs): 23 lb (10.4 kg)   Body Composition  Body Fat %: 41.7 % Fat Mass (lbs): 92.4 lbs Muscle Mass (lbs): 122.8 lbs Total Body Water (lbs): 90 lbs Visceral Fat Rating : 12   Other Clinical Data Fasting: no Labs: no Today's Visit #: 45 Starting Date: 04/22/20    OBESITY Elsey is here to discuss her progress with her obesity treatment plan along with follow-up of her obesity related diagnoses.    Nutrition Plan: keeping a food journal with goal of 1200 calories and 80 grams of protein daily - 40% adherence.  Current exercise: none  Interim History:  She is down another 4 lbs since her last visit. She is feeling better after her surgery.  Protein intake is as prescribed, Is not skipping meals, Water intake is adequate., Reports polyphagia, and Reports excessive cravings.   Pharmacotherapy: Lilyahna is on Wegovy 0.50 mg SQ weekly Adverse side effects: None Hunger is moderately controlled.  Cravings are moderately controlled.    Assessment/Plan:   Anesia Huther endorses excessive hunger.  Medication(s): Wegovy 0.5 mg Effects of medication:  moderately controlled. Cravings are moderately controlled.   Plan: Medication(s): Wegovy 1.0 mg SQ weekly Will increase water, protein and fiber to help assuage hunger.  Will minimize foods that have a high glucose index/load to minimize reactive hypoglycemia.  Discussed low calorie, high  protein breakfast options via [facility].   Prediabetes Last A1c was 5.4  Medication(s): Wegovy 0.5 mg without any side effects.   Lab Results  Component Value Date   HGBA1C 5.4 02/24/2023   HGBA1C 5.3 03/10/2022   HGBA1C 5.4 02/04/2021   HGBA1C 5.4 10/10/2020   HGBA1C 5.7 (H) 04/22/2020   Lab Results  Component Value Date   INSULIN 4.3 02/24/2023   INSULIN 5.7 03/10/2022   INSULIN 6.8 02/04/2021   INSULIN 6.0 10/10/2020   INSULIN 10.7 04/22/2020    Plan:  Will minimize all refined carbohydrates both sweets and starches.  Will work on the plan and exercise.  Consider both aerobic and resistance training.  Will keep protein, water, and fiber intake high.  Increase Polyunsaturated and Monounsaturated fats to increase satiety and encourage weight loss.  Aim for 7 to 9 hours of sleep nightly.  Will continue medications.  Continue and increase dose Wegovy 1.0 mg SQ weekly    Generalized Obesity: Current BMI BMI (Calculated): 33.61   Pharmacotherapy Plan Continue and increase dose  Wegovy 1.0 mg  SQ weekly  Ludene is currently in the action stage of change. As such, her goal is to continue with weight loss efforts.  She has agreed to keeping a food journal with goal of 1,200 calories and 80 grams of protein daily.  Exercise goals: All adults should avoid inactivity. Some physical activity is better than none, and adults who participate in any amount of physical activity gain some health benefits.  Behavioral modification strategies: increasing lean protein intake, decreasing simple carbohydrates , no meal skipping, decrease eating out, meal planning , increase water intake, better snacking choices, planning for success, increasing vegetables, increasing fiber rich foods, get rid of junk food in the home, decrease snacking , avoiding temptations, keep healthy foods in the home, weigh protein portions, and mindful eating.  Miamore has agreed to follow-up with our clinic in 4 weeks.        Objective:   VITALS: Per patient if applicable, see vitals. GENERAL: Alert and in no acute distress. CARDIOPULMONARY: No increased WOB. Speaking in clear sentences.  PSYCH: Pleasant and cooperative. Speech normal rate and rhythm. Affect is appropriate. Insight and judgement are appropriate. Attention is focused, linear, and appropriate.  NEURO: Oriented as arrived to appointment on time with no prompting.   Attestation Statements:   This was prepared with the assistance of Engineer, civil (consulting).  Occasional wrong-word or sound-a-like substitutions may have occurred due to the inherent limitations of voice recognition   Corinna Capra, DO

## 2023-08-20 ENCOUNTER — Other Ambulatory Visit (HOSPITAL_COMMUNITY): Payer: Self-pay

## 2023-08-23 ENCOUNTER — Other Ambulatory Visit: Payer: Self-pay

## 2023-08-31 DIAGNOSIS — M461 Sacroiliitis, not elsewhere classified: Secondary | ICD-10-CM | POA: Diagnosis not present

## 2023-09-08 DIAGNOSIS — Z9009 Acquired absence of other part of head and neck: Secondary | ICD-10-CM | POA: Diagnosis not present

## 2023-09-16 ENCOUNTER — Other Ambulatory Visit (HOSPITAL_COMMUNITY): Payer: Self-pay

## 2023-09-16 ENCOUNTER — Ambulatory Visit: Payer: BC Managed Care – PPO | Admitting: Bariatrics

## 2023-09-16 ENCOUNTER — Encounter: Payer: Self-pay | Admitting: Bariatrics

## 2023-09-16 VITALS — BP 108/69 | HR 84 | Temp 98.0°F | Ht 68.0 in | Wt 218.0 lb

## 2023-09-16 DIAGNOSIS — I10 Essential (primary) hypertension: Secondary | ICD-10-CM | POA: Diagnosis not present

## 2023-09-16 DIAGNOSIS — E669 Obesity, unspecified: Secondary | ICD-10-CM

## 2023-09-16 DIAGNOSIS — R632 Polyphagia: Secondary | ICD-10-CM

## 2023-09-16 DIAGNOSIS — E6609 Other obesity due to excess calories: Secondary | ICD-10-CM

## 2023-09-16 DIAGNOSIS — Z6833 Body mass index (BMI) 33.0-33.9, adult: Secondary | ICD-10-CM | POA: Diagnosis not present

## 2023-09-16 MED ORDER — WEGOVY 1.7 MG/0.75ML ~~LOC~~ SOAJ
1.7000 mg | SUBCUTANEOUS | 0 refills | Status: DC
  Filled 2023-09-16: qty 3, 28d supply, fill #0

## 2023-09-16 NOTE — Progress Notes (Signed)
WEIGHT SUMMARY AND BIOMETRICS  Weight Lost Since Last Visit: 3lb  Weight Gained Since Last Visit: 0   Vitals Temp: 98 F (36.7 C) BP: 108/69 Pulse Rate: 84 SpO2: 97 %   Anthropometric Measurements Height: 5\' 8"  (1.727 m) Weight: 218 lb (98.9 kg) BMI (Calculated): 33.15 Weight at Last Visit: 221lb Weight Lost Since Last Visit: 3lb Weight Gained Since Last Visit: 0 Starting Weight: 244lb Total Weight Loss (lbs): 26 lb (11.8 kg)   Body Composition  Body Fat %: 40.7 % Fat Mass (lbs): 89 lbs Muscle Mass (lbs): 123 lbs Total Body Water (lbs): 86.6 lbs Visceral Fat Rating : 12   Other Clinical Data Fasting: no Labs: no Today's Visit #: 71 Starting Date: 04/22/20    OBESITY Liler is here to discuss her progress with her obesity treatment plan along with follow-up of her obesity related diagnoses.    Nutrition Plan: keeping a food journal with goal of 1200 calories and 80 grams of protein daily - 40% adherence.  Current exercise:  gym  Interim History:  She is down 3 lbs since her last visit.  Eating all of the food on the plan., Protein intake is as prescribed, Is not exceeding snack calorie allotment, Reports polyphagia, and Denies excessive cravings.   Pharmacotherapy: Kynsley is on Wegovy 1.0 mg SQ weekly Adverse side effects: None Hunger is poorly controlled.  Cravings are moderately controlled.  Assessment/Plan:   Onna Nahar endorses excessive hunger.  Medication(s): Wegovy 1 mg  Effects of medication:  poorly controlled. Cravings are moderately controlled.   Plan: Medication(s): Wegovy 1.7 SQ weekly Will increase water, protein and fiber to help assuage hunger.  Will minimize foods that have a high glucose index/load to minimize reactive hypoglycemia.   Hypertension Hypertension well controlled.  Medication(s): Cozaar 50 mg  daily ,  BP Readings from Last 3 Encounters:  09/16/23 108/69  08/19/23 125/70  07/22/23 129/75   Lab Results  Component Value Date   CREATININE 0.92 06/08/2023   CREATININE 0.97 02/24/2023   CREATININE 0.94 03/10/2022   No results found for: "GFR"  Plan: Continue all antihypertensives at current dosages. No added salt. Will keep sodium content to 1,500 mg or less per day.   Will continue to work on diet and exercie.     Generalized Obesity: Current BMI BMI (Calculated): 33.15   Pharmacotherapy Plan Continue and increase dose  Wegovy 1.7 SQ weekly  Khamari is currently in the action stage of change. As such, her goal is to continue with weight loss efforts.  She has agreed to keeping a food journal with goal of 1,200 calories and 80 grams of protein daily.  Exercise goals: For substantial health benefits, adults should do at least 150 minutes (2 hours and 30 minutes) a week of moderate-intensity, or 75 minutes (1 hour and 15 minutes) a week of vigorous-intensity aerobic physical  activity, or an equivalent combination of moderate- and vigorous-intensity aerobic activity. Aerobic activity should be performed in episodes of at least 10 minutes, and preferably, it should be spread throughout the week. She will get in more cardio during the new year.   Behavioral modification strategies: increasing lean protein intake, no meal skipping, decrease eating out, meal planning , increase water intake, better snacking choices, planning for success, increasing vegetables, increasing lower sugar fruits, avoiding temptations, keep healthy foods in the home, holiday eating strategies , weigh protein portions, and mindful eating.  Myriam has agreed to follow-up with our clinic in 4 weeks.      Objective:   VITALS: Per patient if applicable, see vitals. GENERAL: Alert and in no acute distress. CARDIOPULMONARY: No increased WOB. Speaking in clear sentences.  PSYCH: Pleasant and cooperative.  Speech normal rate and rhythm. Affect is appropriate. Insight and judgement are appropriate. Attention is focused, linear, and appropriate.  NEURO: Oriented as arrived to appointment on time with no prompting.   Attestation Statements:   This was prepared with the assistance of Engineer, civil (consulting).  Occasional wrong-word or sound-a-like substitutions may have occurred due to the inherent limitations of voice recognition   Corinna Capra, DO

## 2023-09-21 ENCOUNTER — Other Ambulatory Visit (HOSPITAL_COMMUNITY): Payer: Self-pay

## 2023-10-14 ENCOUNTER — Ambulatory Visit: Payer: BC Managed Care – PPO | Admitting: Bariatrics

## 2023-10-14 ENCOUNTER — Encounter: Payer: Self-pay | Admitting: Bariatrics

## 2023-10-14 VITALS — BP 117/72 | HR 67 | Temp 97.5°F | Ht 68.0 in | Wt 223.0 lb

## 2023-10-14 DIAGNOSIS — Z6833 Body mass index (BMI) 33.0-33.9, adult: Secondary | ICD-10-CM | POA: Diagnosis not present

## 2023-10-14 DIAGNOSIS — E669 Obesity, unspecified: Secondary | ICD-10-CM

## 2023-10-14 DIAGNOSIS — E66811 Obesity, class 1: Secondary | ICD-10-CM

## 2023-10-14 DIAGNOSIS — R632 Polyphagia: Secondary | ICD-10-CM

## 2023-10-14 DIAGNOSIS — E6609 Other obesity due to excess calories: Secondary | ICD-10-CM

## 2023-10-14 DIAGNOSIS — R7303 Prediabetes: Secondary | ICD-10-CM | POA: Diagnosis not present

## 2023-10-14 MED ORDER — WEGOVY 1.7 MG/0.75ML ~~LOC~~ SOAJ
1.7000 mg | SUBCUTANEOUS | 0 refills | Status: DC
  Filled 2023-10-14: qty 3, 28d supply, fill #0

## 2023-10-14 NOTE — Progress Notes (Signed)
 WEIGHT SUMMARY AND BIOMETRICS  Weight Lost Since Last Visit: 0  Weight Gained Since Last Visit: 5lb   Vitals Temp: (!) 97.5 F (36.4 C) BP: 117/72 Pulse Rate: 67 SpO2: 99 %   Anthropometric Measurements Height: 5' 8 (1.727 m) Weight: 223 lb (101.2 kg) BMI (Calculated): 33.91 Weight at Last Visit: 218lb Weight Lost Since Last Visit: 0 Weight Gained Since Last Visit: 5lb Starting Weight: 244lb Total Weight Loss (lbs): 21 lb (9.526 kg)   Body Composition  Body Fat %: 42.3 % Fat Mass (lbs): 94.6 lbs Muscle Mass (lbs): 122.6 lbs Total Body Water (lbs): 89.8 lbs Visceral Fat Rating : 12   Other Clinical Data Fasting: no Labs: no Today's Visit #: 21 Starting Date: 04/22/20    OBESITY Jessica Drake is here to discuss her progress with her obesity treatment plan along with follow-up of her obesity related diagnoses.    Nutrition Plan: keeping a food journal with goal of 1200 calories and 80 grams of protein daily - 40% adherence.  Current exercise: none  Interim History:  She is up about 5 lbs since her last visit.  Eating all of the food on the plan., Is not skipping meals, Water intake is adequate., and Denies polyphagia   Pharmacotherapy: Jessica Drake is on Wegovy  1.7 SQ weekly Adverse side effects: None Hunger is moderately controlled.  Cravings are moderately controlled.  Assessment/Plan:   Jessica Drake endorses excessive hunger.  Medication(s): Wegovy  Effects of medication:  moderately controlled. Cravings are moderately controlled.   Plan: Medication(s): Wegovy  1.7 SQ weekly Will increase water, protein and fiber to help assuage hunger.  Will minimize foods that have a high glucose index/load to minimize reactive hypoglycemia.      Generalized Obesity: Current BMI BMI (Calculated): 33.91   Prediabetes Last A1c was 5.4  Medication(s):  Wegovy  1.7 SQ weekly Lab Results  Component Value Date   HGBA1C 5.4 02/24/2023   HGBA1C 5.3 03/10/2022   HGBA1C 5.4 02/04/2021   HGBA1C 5.4 10/10/2020   HGBA1C 5.7 (H) 04/22/2020   Lab Results  Component Value Date   INSULIN  4.3 02/24/2023   INSULIN  5.7 03/10/2022   INSULIN  6.8 02/04/2021   INSULIN  6.0 10/10/2020   INSULIN  10.7 04/22/2020    Plan: Will minimize all refined carbohydrates both sweets and starches.  Will work on the plan and exercise.  Consider both aerobic and resistance training.  Will keep protein, water, and fiber intake high.  Increase Polyunsaturated and Monounsaturated fats to increase satiety and encourage weight loss.  Aim for 7 to 9 hours of sleep nightly.  Will continue medications.  Continue and refill Wegovy  1.7 SQ weekly   Pharmacotherapy Plan Continue and refill  Wegovy  1.7 SQ weekly  Jessica Drake is currently in the action stage of change. As such, her goal is to continue with weight loss efforts.  She has agreed to keeping a  food journal with goal of 1,200 calories and 80 grams of protein daily.  Exercise goals: For substantial health benefits, adults should do at least 150 minutes (2 hours and 30 minutes) a week of moderate-intensity, or 75 minutes (1 hour and 15 minutes) a week of vigorous-intensity aerobic physical activity, or an equivalent combination of moderate- and vigorous-intensity aerobic activity. Aerobic activity should be performed in episodes of at least 10 minutes, and preferably, it should be spread throughout the week.  Behavioral modification strategies: increasing lean protein intake, meal planning , increase water intake, planning for success, increasing fiber rich foods, avoiding temptations, keep healthy foods in the home, weigh protein portions, and mindful eating.  Jessica Drake has agreed to follow-up with our clinic in 4 weeks.      Objective:   VITALS: Per patient if applicable, see vitals. GENERAL: Alert and in no acute  distress. CARDIOPULMONARY: No increased WOB. Speaking in clear sentences.  PSYCH: Pleasant and cooperative. Speech normal rate and rhythm. Affect is appropriate. Insight and judgement are appropriate. Attention is focused, linear, and appropriate.  NEURO: Oriented as arrived to appointment on time with no prompting.   Attestation Statements:    This was prepared with the assistance of Engineer, Civil (consulting).  Occasional wrong-word or sound-a-like substitutions may have occurred due to the inherent limitations of voice recognition   Clayborne Daring, DO

## 2023-10-15 ENCOUNTER — Other Ambulatory Visit (HOSPITAL_COMMUNITY): Payer: Self-pay

## 2023-10-18 DIAGNOSIS — I10 Essential (primary) hypertension: Secondary | ICD-10-CM | POA: Diagnosis not present

## 2023-10-18 DIAGNOSIS — E042 Nontoxic multinodular goiter: Secondary | ICD-10-CM | POA: Diagnosis not present

## 2023-10-18 DIAGNOSIS — E7800 Pure hypercholesterolemia, unspecified: Secondary | ICD-10-CM | POA: Diagnosis not present

## 2023-10-18 DIAGNOSIS — E559 Vitamin D deficiency, unspecified: Secondary | ICD-10-CM | POA: Diagnosis not present

## 2023-11-18 ENCOUNTER — Other Ambulatory Visit (HOSPITAL_COMMUNITY): Payer: Self-pay

## 2023-11-18 ENCOUNTER — Encounter: Payer: Self-pay | Admitting: Bariatrics

## 2023-11-18 ENCOUNTER — Ambulatory Visit: Payer: BC Managed Care – PPO | Admitting: Bariatrics

## 2023-11-18 VITALS — BP 133/80 | HR 76 | Temp 97.7°F | Ht 68.0 in | Wt 233.0 lb

## 2023-11-18 DIAGNOSIS — E669 Obesity, unspecified: Secondary | ICD-10-CM

## 2023-11-18 DIAGNOSIS — R632 Polyphagia: Secondary | ICD-10-CM | POA: Diagnosis not present

## 2023-11-18 DIAGNOSIS — Z6835 Body mass index (BMI) 35.0-35.9, adult: Secondary | ICD-10-CM | POA: Diagnosis not present

## 2023-11-18 DIAGNOSIS — R7303 Prediabetes: Secondary | ICD-10-CM | POA: Diagnosis not present

## 2023-11-18 MED ORDER — WEGOVY 1.7 MG/0.75ML ~~LOC~~ SOAJ
1.7000 mg | SUBCUTANEOUS | 0 refills | Status: DC
  Filled 2023-11-18: qty 3, 28d supply, fill #0

## 2023-11-18 NOTE — Progress Notes (Signed)
WEIGHT SUMMARY AND BIOMETRICS  Weight Lost Since Last Visit: 0  Weight Gained Since Last Visit: 0   Vitals Temp: 97.7 F (36.5 C) BP: 133/80 Pulse Rate: 76 SpO2: 100 %   Anthropometric Measurements Height: 5\' 8"  (1.727 m) Weight: 233 lb (105.7 kg) BMI (Calculated): 35.44 Weight at Last Visit: 223lb Weight Lost Since Last Visit: 0 Weight Gained Since Last Visit: 0 Starting Weight: 244lb Total Weight Loss (lbs): 21 lb (9.526 kg)   Body Composition  Body Fat %: 42.7 % Fat Mass (lbs): 95.2 lbs Muscle Mass (lbs): 121.4 lbs Total Body Water (lbs): 90.4 lbs Visceral Fat Rating : 12   Other Clinical Data Fasting: no Labs: no Today's Visit #: 48 Starting Date: 04/22/20    OBESITY Reisha is here to discuss her progress with her obesity treatment plan along with follow-up of her obesity related diagnoses.    Nutrition Plan: keeping a food journal with goal of 1200 calories and 80 grams of protein daily - 60% adherence.  Current exercise:  Goes to the gym for exercise.  Interim History:  Her weight remains the same as her last visit.  Eating all of the food on the plan., Is not skipping meals, Water intake is adequate., and Denies polyphagia   Pharmacotherapy: Heer is on Wegovy 1.7 SQ weekly Adverse side effects: None Hunger is moderately controlled.  Cravings are moderately controlled.  Assessment/Plan:    Laira Penninger endorses excessive hunger.  Medication(s): ZOXWRU Effects of medication:  moderately controlled. Cravings are moderately controlled.   Plan: Medication(s): Wegovy 1.7 SQ weekly Will increase water, protein and fiber to help assuage hunger.  Will minimize foods that have a high glucose index/load to minimize reactive hypoglycemia.   Prediabetes Last A1c was 5.4  Medication(s):  Wegovy 1.7 SQ weekly Lab Results   Component Value Date   HGBA1C 5.4 02/24/2023   HGBA1C 5.3 03/10/2022   HGBA1C 5.4 02/04/2021   HGBA1C 5.4 10/10/2020   HGBA1C 5.7 (H) 04/22/2020   Lab Results  Component Value Date   INSULIN 4.3 02/24/2023   INSULIN 5.7 03/10/2022   INSULIN 6.8 02/04/2021   INSULIN 6.0 10/10/2020   INSULIN 10.7 04/22/2020    Plan: Will consider " Factor" meals.  Will continue her work-outs at the gym, both cardio and resistance.  Will minimize all refined carbohydrates both sweets and starches.  Will work on the plan and exercise.  Consider both aerobic and resistance training.  Will keep protein, water, and fiber intake high.  Continue and refill Wegovy 1.7 SQ weekly    Generalized Obesity: Current BMI BMI (Calculated): 35.44   Pharmacotherapy Plan Continue and refill  Wegovy 1.7 SQ weekly  Reba is currently in the action stage of change. As such, her goal is to continue with weight loss efforts.  She has agreed to keeping a food journal with goal of  1,200 calories and 80 grams of protein daily.  Exercise goals: For substantial health benefits, adults should do at least 150 minutes (2 hours and 30 minutes) a week of moderate-intensity, or 75 minutes (1 hour and 15 minutes) a week of vigorous-intensity aerobic physical activity, or an equivalent combination of moderate- and vigorous-intensity aerobic activity. Aerobic activity should be performed in episodes of at least 10 minutes, and preferably, it should be spread throughout the week.  Behavioral modification strategies: increasing lean protein intake, no meal skipping, meal planning , better snacking choices, planning for success, increasing vegetables, avoiding temptations, keep healthy foods in the home, and mindful eating.  Chelle has agreed to follow-up with our clinic in 4 weeks.      Objective:   VITALS: Per patient if applicable, see vitals. GENERAL: Alert and in no acute distress. CARDIOPULMONARY: No increased WOB.  Speaking in clear sentences.  PSYCH: Pleasant and cooperative. Speech normal rate and rhythm. Affect is appropriate. Insight and judgement are appropriate. Attention is focused, linear, and appropriate.  NEURO: Oriented as arrived to appointment on time with no prompting.   Attestation Statements:   This was prepared with the assistance of Engineer, civil (consulting).  Occasional wrong-word or sound-a-like substitutions may have occurred due to the inherent limitations of voice recognition   Corinna Capra, DO

## 2023-12-02 DIAGNOSIS — M47816 Spondylosis without myelopathy or radiculopathy, lumbar region: Secondary | ICD-10-CM | POA: Diagnosis not present

## 2023-12-02 DIAGNOSIS — M461 Sacroiliitis, not elsewhere classified: Secondary | ICD-10-CM | POA: Diagnosis not present

## 2023-12-13 DIAGNOSIS — M461 Sacroiliitis, not elsewhere classified: Secondary | ICD-10-CM | POA: Diagnosis not present

## 2023-12-16 ENCOUNTER — Other Ambulatory Visit (HOSPITAL_COMMUNITY): Payer: Self-pay

## 2023-12-16 ENCOUNTER — Encounter: Payer: Self-pay | Admitting: Bariatrics

## 2023-12-16 ENCOUNTER — Ambulatory Visit: Payer: BC Managed Care – PPO | Admitting: Bariatrics

## 2023-12-16 VITALS — BP 137/88 | HR 75 | Temp 98.1°F | Ht 68.0 in | Wt 218.0 lb

## 2023-12-16 DIAGNOSIS — Z6833 Body mass index (BMI) 33.0-33.9, adult: Secondary | ICD-10-CM | POA: Diagnosis not present

## 2023-12-16 DIAGNOSIS — R632 Polyphagia: Secondary | ICD-10-CM

## 2023-12-16 DIAGNOSIS — E66811 Obesity, class 1: Secondary | ICD-10-CM

## 2023-12-16 DIAGNOSIS — E669 Obesity, unspecified: Secondary | ICD-10-CM | POA: Diagnosis not present

## 2023-12-16 DIAGNOSIS — R7303 Prediabetes: Secondary | ICD-10-CM

## 2023-12-16 MED ORDER — WEGOVY 1.7 MG/0.75ML ~~LOC~~ SOAJ
1.7000 mg | SUBCUTANEOUS | 0 refills | Status: DC
  Filled 2023-12-16: qty 3, 28d supply, fill #0

## 2023-12-16 NOTE — Progress Notes (Signed)
 WEIGHT SUMMARY AND BIOMETRICS  Weight remained the same.   Weight Gained Since Last Visit: 0   Vitals Temp: 98.1 F (36.7 C) BP: 137/88 Pulse Rate: 75 SpO2: 99 %   Anthropometric Measurements Height: 5\' 8"  (1.727 m) Weight: 218 lb (98.9 kg) BMI (Calculated): 33.15 Weight at Last Visit: 223 lb Weight Lost Since Last Visit: 15 lb Weight Gained Since Last Visit: 0 Starting Weight: 244 lb Total Weight Loss (lbs): 36 lb (16.3 kg)   Body Composition  Body Fat %: 41.6 % Fat Mass (lbs): 90.8 lbs Muscle Mass (lbs): 121 lbs Total Body Water (lbs): 85.4 lbs Visceral Fat Rating : 12   Other Clinical Data Fasting: No Labs: No Today's Visit #: 18 Starting Date: 04/22/20    OBESITY Jessica Drake is here to discuss her progress with her obesity treatment plan along with follow-up of her obesity related diagnoses.    Nutrition Plan: the Category 2 plan - 60% adherence.  Current exercise: cardiovascular workout on exercise equipment  Interim History:  Her weight remained the same since her last visit. She states that she had steroid injections in her back on Monday of this week and that she is having less pain and is able to be more flexible.  Eating all of the food on the plan., Protein intake is as prescribed, Not journaling consistently., Not meeting protein goals., and Water intake is adequate.  Hunger is moderately controlled.  Cravings are moderately controlled.  Assessment/Plan:   Jessica Drake endorses excessive hunger.  Medication(s): WNUUVO Effects of medication:  moderately controlled. Cravings are moderately controlled.   Plan: Medication(s): Wegovy 1.7 SQ weekly Will increase water, protein and fiber to help assuage hunger.  Will minimize foods that have a high glucose index/load to minimize reactive hypoglycemia.  She will will weigh and  measure her food.  She will resume her journaling.   Prediabetes Last A1c was 5.4  Medication(s):  Wegovy 1.7 SQ weekly Lab Results  Component Value Date   HGBA1C 5.4 02/24/2023   HGBA1C 5.3 03/10/2022   HGBA1C 5.4 02/04/2021   HGBA1C 5.4 10/10/2020   HGBA1C 5.7 (H) 04/22/2020   Lab Results  Component Value Date   INSULIN 4.3 02/24/2023   INSULIN 5.7 03/10/2022   INSULIN 6.8 02/04/2021   INSULIN 6.0 10/10/2020   INSULIN 10.7 04/22/2020    Plan: Will minimize all refined carbohydrates both sweets and starches.  Will work on the plan and exercise.  Consider both aerobic and resistance training.  Will keep protein, water, and fiber intake high.  Increase Polyunsaturated and Monounsaturated fats to increase satiety and encourage weight loss.  Aim for 7 to 9 hours of sleep nightly.  Continue and refill Wegovy 1.7 SQ weekly     Generalized Obesity: Current BMI BMI (Calculated): 33.15   Pharmacotherapy Plan Continue and refill  Wegovy 1.7 SQ weekly  Jessica Drake  is currently in the action stage of change. As such, her goal is to continue with weight loss efforts.  She has agreed to keeping a food journal with goal of 1,200 calories and 80 grams of protein daily.  Exercise goals: All adults should avoid inactivity. Some physical activity is better than none, and adults who participate in any amount of physical activity gain some health benefits. She will be going to the gym today and will do so more often.   Behavioral modification strategies: no meal skipping, decrease eating out, meal planning , increase water intake, better snacking choices, planning for success, increasing fiber rich foods, keep healthy foods in the home, increase frequency of journaling, weigh protein portions, and work on smaller portions.  Jessica Drake has agreed to follow-up with our clinic in 4 weeks.    Medications Discontinued During This Encounter  Medication Reason   celecoxib (CELEBREX) 200 MG capsule  Patient Preference        Objective:   VITALS: Per patient if applicable, see vitals. GENERAL: Alert and in no acute distress. CARDIOPULMONARY: No increased WOB. Speaking in clear sentences.  PSYCH: Pleasant and cooperative. Speech normal rate and rhythm. Affect is appropriate. Insight and judgement are appropriate. Attention is focused, linear, and appropriate.  NEURO: Oriented as arrived to appointment on time with no prompting.   Attestation Statements:    This was prepared with the assistance of Engineer, civil (consulting).  Occasional wrong-word or sound-a-like substitutions may have occurred due to the inherent limitations of voice recognition   Corinna Capra, DO

## 2023-12-29 DIAGNOSIS — R509 Fever, unspecified: Secondary | ICD-10-CM | POA: Diagnosis not present

## 2024-01-13 ENCOUNTER — Ambulatory Visit: Admitting: Bariatrics

## 2024-01-13 ENCOUNTER — Encounter: Payer: Self-pay | Admitting: Bariatrics

## 2024-01-13 ENCOUNTER — Other Ambulatory Visit (HOSPITAL_COMMUNITY): Payer: Self-pay

## 2024-01-13 VITALS — BP 121/76 | HR 72 | Temp 97.7°F | Ht 68.0 in | Wt 215.0 lb

## 2024-01-13 DIAGNOSIS — R632 Polyphagia: Secondary | ICD-10-CM | POA: Diagnosis not present

## 2024-01-13 DIAGNOSIS — R7303 Prediabetes: Secondary | ICD-10-CM | POA: Diagnosis not present

## 2024-01-13 DIAGNOSIS — E6609 Other obesity due to excess calories: Secondary | ICD-10-CM

## 2024-01-13 DIAGNOSIS — E669 Obesity, unspecified: Secondary | ICD-10-CM

## 2024-01-13 DIAGNOSIS — Z6832 Body mass index (BMI) 32.0-32.9, adult: Secondary | ICD-10-CM | POA: Diagnosis not present

## 2024-01-13 MED ORDER — WEGOVY 2.4 MG/0.75ML ~~LOC~~ SOAJ
2.4000 mg | SUBCUTANEOUS | 0 refills | Status: DC
  Filled 2024-01-13: qty 3, 28d supply, fill #0

## 2024-01-13 NOTE — Progress Notes (Signed)
 WEIGHT SUMMARY AND BIOMETRICS  Weight Lost Since Last Visit: 3lb  Weight Gained Since Last Visit: 0   Vitals Temp: 97.7 F (36.5 C) BP: 121/76 Pulse Rate: 72 SpO2: 99 %   Anthropometric Measurements Height: 5\' 8"  (1.727 m) Weight: 215 lb (97.5 kg) BMI (Calculated): 32.7 Weight at Last Visit: 218lb Weight Lost Since Last Visit: 3lb Weight Gained Since Last Visit: 0 Starting Weight: 244lb Total Weight Loss (lbs): 39 lb (17.7 kg)   Body Composition  Body Fat %: 42.5 % Fat Mass (lbs): 91.4 lbs Muscle Mass (lbs): 117.4 lbs Total Body Water (lbs): 86.2 lbs Visceral Fat Rating : 12   Other Clinical Data Fasting: no Labs: no Today's Visit #: 50 Starting Date: 04/22/20    OBESITY Fatuma is here to discuss her progress with her obesity treatment plan along with follow-up of her obesity related diagnoses.    Nutrition Plan: the Category 2 plan - 60% adherence.  Current exercise: cardiovascular workout on exercise equipment and weightlifting  Interim History:  She is down 3 lbs since her last visit.  Eating all of the food on the plan., Protein intake is less than prescribed., and Water intake is adequate.   Pharmacotherapy: Kohana is on Wegovy 1.7 SQ weekly Adverse side effects: None Hunger is moderately controlled.  Cravings are moderately controlled.  Assessment/Plan:   Prediabetes Last A1c was 5.4  Medication(s):Wegovy 1.7 SQ weekly Lab Results  Component Value Date   HGBA1C 5.4 02/24/2023   HGBA1C 5.3 03/10/2022   HGBA1C 5.4 02/04/2021   HGBA1C 5.4 10/10/2020   HGBA1C 5.7 (H) 04/22/2020   Lab Results  Component Value Date   INSULIN 4.3 02/24/2023   INSULIN 5.7 03/10/2022   INSULIN 6.8 02/04/2021   INSULIN 6.0 10/10/2020   INSULIN 10.7 04/22/2020    Plan: Will increase her fiber to greater than 30 g/day. Discussed fiber in  general and given a fiber sheet.  She will continue riding her recumbent bike and will add in weights. Will minimize all refined carbohydrates both sweets and starches.  Will work on the plan and exercise.  Consider both aerobic and resistance training.  Will keep protein, water, and fiber intake high.  Increase Polyunsaturated and Monounsaturated fats to increase satiety and encourage weight loss.  Aim for 7 to 9 hours of sleep nightly.  Continue and increase dose Wegovy 2.4 mg SQ weekly   Polyphagia Akeya endorses excessive hunger.  Medication(s): [id number] Effects of medication:  moderately controlled. Cravings are moderately controlled.   Plan: Medication(s): Wegovy 2.4 mg SQ weekly Will increase water, protein and fiber to help assuage hunger.  Will minimize foods that have a high glucose index/load to minimize reactive hypoglycemia.    Generalized Obesity: Current BMI BMI (Calculated): 32.7   Pharmacotherapy Plan Continue and increase dose  Wegovy 2.4 mg SQ weekly  Tahari is currently in the  action stage of change. As such, her goal is to continue with weight loss efforts.  She has agreed to the Category 2 plan.  Exercise goals: u  Behavioral modification strategies: increasing lean protein intake, no meal skipping, decrease eating out, meal planning , increase water intake, better snacking choices, and planning for success.  Maurissa has agreed to follow-up with our clinic in 4 weeks.    Objective:   VITALS: Per patient if applicable, see vitals. GENERAL: Alert and in no acute distress. CARDIOPULMONARY: No increased WOB. Speaking in clear sentences.  PSYCH: Pleasant and cooperative. Speech normal rate and rhythm. Affect is appropriate. Insight and judgement are appropriate. Attention is focused, linear, and appropriate.  NEURO: Oriented as arrived to appointment on time with no prompting.   Attestation Statements:   This was prepared with the assistance of Licensed conveyancer.  Occasional wrong-word or sound-a-like substitutions may have occurred due to the inherent limitations of voice recognition   Corinna Capra, DO

## 2024-01-15 ENCOUNTER — Other Ambulatory Visit (HOSPITAL_COMMUNITY): Payer: Self-pay

## 2024-02-03 DIAGNOSIS — L738 Other specified follicular disorders: Secondary | ICD-10-CM | POA: Diagnosis not present

## 2024-02-03 DIAGNOSIS — D492 Neoplasm of unspecified behavior of bone, soft tissue, and skin: Secondary | ICD-10-CM | POA: Diagnosis not present

## 2024-02-03 DIAGNOSIS — L570 Actinic keratosis: Secondary | ICD-10-CM | POA: Diagnosis not present

## 2024-02-10 ENCOUNTER — Ambulatory Visit: Admitting: Bariatrics

## 2024-02-10 ENCOUNTER — Other Ambulatory Visit (HOSPITAL_COMMUNITY): Payer: Self-pay

## 2024-02-10 ENCOUNTER — Encounter: Payer: Self-pay | Admitting: Bariatrics

## 2024-02-10 VITALS — BP 128/77 | HR 71 | Temp 97.3°F | Ht 68.0 in | Wt 216.0 lb

## 2024-02-10 DIAGNOSIS — R7303 Prediabetes: Secondary | ICD-10-CM | POA: Diagnosis not present

## 2024-02-10 DIAGNOSIS — E669 Obesity, unspecified: Secondary | ICD-10-CM | POA: Diagnosis not present

## 2024-02-10 DIAGNOSIS — Z6832 Body mass index (BMI) 32.0-32.9, adult: Secondary | ICD-10-CM | POA: Diagnosis not present

## 2024-02-10 DIAGNOSIS — R632 Polyphagia: Secondary | ICD-10-CM

## 2024-02-10 MED ORDER — WEGOVY 2.4 MG/0.75ML ~~LOC~~ SOAJ
2.4000 mg | SUBCUTANEOUS | 0 refills | Status: DC
  Filled 2024-02-10: qty 3, 28d supply, fill #0

## 2024-02-10 NOTE — Progress Notes (Signed)
 WEIGHT SUMMARY AND BIOMETRICS  Weight Lost Since Last Visit: 0  Weight Gained Since Last Visit: 1lb   Vitals Temp: (!) 97.3 F (36.3 C) BP: 128/77 Pulse Rate: 71 SpO2: 99 %   Anthropometric Measurements Height: 5\' 8"  (1.727 m) Weight: 216 lb (98 kg) BMI (Calculated): 32.85 Weight at Last Visit: 215lb Weight Lost Since Last Visit: 0 Weight Gained Since Last Visit: 1lb Starting Weight: 244lb Total Weight Loss (lbs): 38 lb (17.2 kg)   Body Composition  Body Fat %: 39.3 % Fat Mass (lbs): 84.8 lbs Muscle Mass (lbs): 124.6 lbs Total Body Water (lbs): 90.6 lbs Visceral Fat Rating : 11   Other Clinical Data Fasting: no Labs: no Today's Visit #: 69 Starting Date: 04/22/20    OBESITY Jessica Drake is here to discuss her progress with her obesity treatment plan along with follow-up of her obesity related diagnoses.    Nutrition Plan: the Category 2 plan - 70% adherence.  Current exercise: cardiovascular workout on exercise equipment and weightlifting  Interim History:  She is up 1 lb since her last visit.  Eating all of the food on the plan., Protein intake is as prescribed, Is not skipping meals, Meeting protein goals., and Water intake is adequate.   Pharmacotherapy: Jessica Drake is on Wegovy  2.4 mg SQ weekly Adverse side effects: None Hunger is moderately controlled.  Cravings are moderately controlled.  Assessment/Plan:   Prediabetes Last A1c was 5.4  Medication(s):  Wegovy  2.4 mg SQ weekly Lab Results  Component Value Date   HGBA1C 5.4 02/24/2023   HGBA1C 5.3 03/10/2022   HGBA1C 5.4 02/04/2021   HGBA1C 5.4 10/10/2020   HGBA1C 5.7 (H) 04/22/2020   Lab Results  Component Value Date   INSULIN  4.3 02/24/2023   INSULIN  5.7 03/10/2022   INSULIN  6.8 02/04/2021   INSULIN  6.0 10/10/2020   INSULIN  10.7 04/22/2020    Plan: Will minimize all refined  carbohydrates both sweets and starches.  Will work on the plan and exercise.  She is considering doing of more of a low carb plan.  I discussed a low-carb plan with her in detail and referred her to several books the "new Avis Boehringer for you", and the "new Avis Boehringer for you cook book" Consider both aerobic and resistance training.  Will keep protein, water, and fiber intake high.  Increase Polyunsaturated and Monounsaturated fats to increase satiety and encourage weight loss.  Aim for 7 to 9 hours of sleep nightly.   Continue and refill Wegovy  2.4 mg SQ weekly   Polyphagia Jessica Drake endorses excessive hunger.  Medication(s): Wegovy  2.4 mg  Effects of medication:  moderately controlled. Cravings are moderately controlled.   Plan: Medication(s): Wegovy  2.4 mg SQ weekly Will increase water, protein and fiber to help assuage hunger.  Will minimize foods that have a high glucose index/load to minimize reactive hypoglycemia.  Will cut her carbs and will  move slowly to a low-carb diet.     Generalized Obesity: Current BMI BMI (Calculated): 32.85   Pharmacotherapy Plan Continue and refill  Wegovy  2.4 mg SQ weekly  Jessica Drake is currently in the action stage of change. As such, her goal is to continue with weight loss efforts.  She has agreed to keeping a food journal with goal of 1,200 calories and 80 to 90 grams of protein daily and low carbohydrate plan.  Exercise goals: All adults should avoid inactivity. Some physical activity is better than none, and adults who participate in any amount of physical activity gain some health benefits.  Behavioral modification strategies: increasing lean protein intake, no meal skipping, decrease eating out, meal planning , increase water intake, better snacking choices, planning for success, increasing vegetables, avoiding temptations, and mindful eating.  Jessica Drake has agreed to follow-up with our clinic in 4 weeks.     Objective:   VITALS: Per patient if  applicable, see vitals. GENERAL: Alert and in no acute distress. CARDIOPULMONARY: No increased WOB. Speaking in clear sentences.  PSYCH: Pleasant and cooperative. Speech normal rate and rhythm. Affect is appropriate. Insight and judgement are appropriate. Attention is focused, linear, and appropriate.  NEURO: Oriented as arrived to appointment on time with no prompting.   Attestation Statements:    This was prepared with the assistance of Engineer, civil (consulting).  Occasional wrong-word or sound-a-like substitutions may have occurred due to the inherent limitations of voice recognition   Kirk Peper, DO

## 2024-03-15 DIAGNOSIS — M461 Sacroiliitis, not elsewhere classified: Secondary | ICD-10-CM | POA: Diagnosis not present

## 2024-03-16 ENCOUNTER — Encounter: Payer: Self-pay | Admitting: Bariatrics

## 2024-03-16 ENCOUNTER — Ambulatory Visit: Admitting: Bariatrics

## 2024-03-16 ENCOUNTER — Telehealth (INDEPENDENT_AMBULATORY_CARE_PROVIDER_SITE_OTHER): Payer: Self-pay | Admitting: *Deleted

## 2024-03-16 ENCOUNTER — Other Ambulatory Visit (HOSPITAL_COMMUNITY): Payer: Self-pay

## 2024-03-16 VITALS — BP 121/76 | HR 69 | Temp 97.7°F | Ht 68.0 in | Wt 217.0 lb

## 2024-03-16 DIAGNOSIS — E669 Obesity, unspecified: Secondary | ICD-10-CM | POA: Diagnosis not present

## 2024-03-16 DIAGNOSIS — R632 Polyphagia: Secondary | ICD-10-CM | POA: Diagnosis not present

## 2024-03-16 DIAGNOSIS — Z6833 Body mass index (BMI) 33.0-33.9, adult: Secondary | ICD-10-CM | POA: Diagnosis not present

## 2024-03-16 DIAGNOSIS — I10 Essential (primary) hypertension: Secondary | ICD-10-CM

## 2024-03-16 DIAGNOSIS — Z6832 Body mass index (BMI) 32.0-32.9, adult: Secondary | ICD-10-CM

## 2024-03-16 MED ORDER — WEGOVY 2.4 MG/0.75ML ~~LOC~~ SOAJ
2.4000 mg | SUBCUTANEOUS | 0 refills | Status: DC
Start: 2024-03-16 — End: 2024-04-13
  Filled 2024-03-16: qty 3, 28d supply, fill #0

## 2024-03-16 NOTE — Telephone Encounter (Signed)
 Message from plan: Request Reference Number: [id number]. WEGOVY  INJ 2.4MG  is approved through 09/15/2024. Your patient may now fill this prescription and it will be covered.. Authorization Expiration Date: September 15, 2024.   Patient notified via Mychart message.

## 2024-03-16 NOTE — Progress Notes (Signed)
 WEIGHT SUMMARY AND BIOMETRICS  Weight Lost Since Last Visit: 0  Weight Gained Since Last Visit: 1lb   Vitals Temp: 97.7 F (36.5 C) BP: 121/76 Pulse Rate: 69 SpO2: 100 %   Anthropometric Measurements Height: 5' 8 (1.727 m) Weight: 217 lb (98.4 kg) BMI (Calculated): 33 Weight at Last Visit: 216lb Weight Lost Since Last Visit: 0 Weight Gained Since Last Visit: 1lb Starting Weight: 244lb Total Weight Loss (lbs): 37 lb (16.8 kg)   Body Composition  Body Fat %: 40 % Fat Mass (lbs): 87 lbs Muscle Mass (lbs): 123.8 lbs Total Body Water (lbs): 93.2 lbs Visceral Fat Rating : 11   Other Clinical Data Fasting: no Labs: no Today's Visit #: 72 Starting Date: 04/22/20    OBESITY Jessica Drake is here to discuss her progress with her obesity treatment plan along with follow-up of her obesity related diagnoses.    Nutrition Plan: the Category 2 plan - 20% adherence.  Current exercise: cardiovascular workout on exercise equipment and weightlifting  Interim History:  She is up 1 lb since her last visit.  Eating all of the food on the plan., Is not skipping meals, Not journaling consistently., Meeting protein goals., and Water intake is adequate.   Pharmacotherapy: Jessica Drake is on Wegovy  2.4 mg SQ weekly Adverse side effects: None Hunger is moderately controlled.  Cravings are moderately controlled.  Assessment/Plan:   Jessica Drake endorses excessive hunger.  Medication(s): Wegovy  2.4 mg  Effects of medication (appetite) :  moderately controlled. Cravings are moderately controlled.   Plan: Medication(s): Wegovy  2.4 mg SQ weekly Will increase water, protein and fiber to help assuage hunger.  Will minimize foods that have a high glucose index/load to minimize reactive hypoglycemia.    Hypertension Hypertension well controlled.  Medication(s): Cozaar  50  mg daily   BP Readings from Last 3 Encounters:  03/16/24 121/76  02/10/24 128/77  01/13/24 121/76   Lab Results  Component Value Date   CREATININE 0.92 06/08/2023   CREATININE 0.97 02/24/2023   CREATININE 0.94 03/10/2022   No results found for: GFR  Plan: Continue all antihypertensives at current dosages. No added salt. Will keep sodium content to 1,500 mg or less per day.     Generalized Obesity: Current BMI BMI (Calculated): 33   Pharmacotherapy Plan Continue and refill  Wegovy  2.4 mg SQ weekly  Jessica Drake is currently in the action stage of change. As such, her goal is to continue with weight loss efforts.  She has agreed to the Category 2 plan.  Exercise goals: All adults should avoid inactivity. Some physical activity is better than none, and adults who participate in any amount of physical activity gain some health benefits.  Behavioral modification strategies: increasing lean protein intake, meal planning , increase water intake, better snacking choices, planning for success, keep healthy foods in the home, weigh protein portions, measure portion sizes, and  mindful eating.  Jessica Drake has agreed to follow-up with our clinic in 4 weeks.      Objective:   VITALS: Per patient if applicable, see vitals. GENERAL: Alert and in no acute distress. CARDIOPULMONARY: No increased WOB. Speaking in clear sentences.  PSYCH: Pleasant and cooperative. Speech normal rate and rhythm. Affect is appropriate. Insight and judgement are appropriate. Attention is focused, linear, and appropriate.  NEURO: Oriented as arrived to appointment on time with no prompting.   Attestation Statements:   This was prepared with the assistance of Engineer, civil (consulting).  Occasional wrong-word or sound-a-like substitutions may have occurred due to the inherent limitations of voice recognition   Kirk Peper, DO

## 2024-03-16 NOTE — Telephone Encounter (Signed)
 PA SUBMITTED VIA [employer]  Eliberto Grosser (Key: [facility])  Your information has been sent to [facility].

## 2024-03-17 ENCOUNTER — Other Ambulatory Visit (HOSPITAL_COMMUNITY): Payer: Self-pay

## 2024-03-21 DIAGNOSIS — I788 Other diseases of capillaries: Secondary | ICD-10-CM | POA: Diagnosis not present

## 2024-03-21 DIAGNOSIS — L821 Other seborrheic keratosis: Secondary | ICD-10-CM | POA: Diagnosis not present

## 2024-03-21 DIAGNOSIS — Z872 Personal history of diseases of the skin and subcutaneous tissue: Secondary | ICD-10-CM | POA: Diagnosis not present

## 2024-03-24 ENCOUNTER — Other Ambulatory Visit (HOSPITAL_COMMUNITY): Payer: Self-pay

## 2024-04-13 ENCOUNTER — Encounter: Payer: Self-pay | Admitting: Bariatrics

## 2024-04-13 ENCOUNTER — Other Ambulatory Visit (HOSPITAL_COMMUNITY): Payer: Self-pay

## 2024-04-13 ENCOUNTER — Ambulatory Visit: Admitting: Bariatrics

## 2024-04-13 VITALS — BP 123/77 | HR 69 | Temp 97.6°F | Ht 68.0 in | Wt 216.0 lb

## 2024-04-13 DIAGNOSIS — R632 Polyphagia: Secondary | ICD-10-CM

## 2024-04-13 DIAGNOSIS — F5089 Other specified eating disorder: Secondary | ICD-10-CM

## 2024-04-13 DIAGNOSIS — R7303 Prediabetes: Secondary | ICD-10-CM

## 2024-04-13 DIAGNOSIS — E669 Obesity, unspecified: Secondary | ICD-10-CM | POA: Diagnosis not present

## 2024-04-13 DIAGNOSIS — Z6832 Body mass index (BMI) 32.0-32.9, adult: Secondary | ICD-10-CM | POA: Diagnosis not present

## 2024-04-13 MED ORDER — BUPROPION HCL ER (SR) 150 MG PO TB12
150.0000 mg | ORAL_TABLET | Freq: Every day | ORAL | 0 refills | Status: DC
  Filled 2024-04-13: qty 30, 30d supply, fill #0

## 2024-04-13 MED ORDER — WEGOVY 2.4 MG/0.75ML ~~LOC~~ SOAJ
2.4000 mg | SUBCUTANEOUS | 0 refills | Status: DC
  Filled 2024-04-13: qty 3, 28d supply, fill #0

## 2024-04-13 NOTE — Progress Notes (Signed)
 WEIGHT SUMMARY AND BIOMETRICS  Weight Lost Since Last Visit: 1lb  Weight Gained Since Last Visit: 0   Vitals Temp: 97.6 F (36.4 C) BP: 123/77 Pulse Rate: 69 SpO2: 99 %   Anthropometric Measurements Height: 5' 8 (1.727 m) Weight: 216 lb (98 kg) BMI (Calculated): 32.85 Weight at Last Visit: 217lb Weight Lost Since Last Visit: 1lb Weight Gained Since Last Visit: 0 Starting Weight: 244lb Total Weight Loss (lbs): 38 lb (17.2 kg)   Body Composition  Body Fat %: 39.4 % Fat Mass (lbs): 85.4 lbs Muscle Mass (lbs): 124.8 lbs Total Body Water (lbs): 90.4 lbs Visceral Fat Rating : 11   Other Clinical Data Fasting: no Labs: no Today's Visit #: 48 Starting Date: 04/22/20    OBESITY Tametra is here to discuss her progress with her obesity treatment plan along with follow-up of her obesity related diagnoses.    Nutrition Plan: the Category 2 plan - 40% adherence.  Current exercise: cardiovascular workout on exercise equipment  Interim History:  She is down 1 lb since her last visit.  Eating all of the food on the plan., Journaling consistently., Water intake is adequate., and Reports polyphagia   Pharmacotherapy: Phyllis is on Wegovy  2.4 mg SQ weekly Adverse side effects: None Hunger is moderately controlled.  Cravings are poorly controlled.  Assessment/Plan:   Prediabetes Last A1c was 5.4  Medication(s): Wegovy  2.4 mg SQ weekly Lab Results  Component Value Date   HGBA1C 5.4 02/24/2023   HGBA1C 5.3 03/10/2022   HGBA1C 5.4 02/04/2021   HGBA1C 5.4 10/10/2020   HGBA1C 5.7 (H) 04/22/2020   Lab Results  Component Value Date   INSULIN  4.3 02/24/2023   INSULIN  5.7 03/10/2022   INSULIN  6.8 02/04/2021   INSULIN  6.0 10/10/2020   INSULIN  10.7 04/22/2020    Plan: Will minimize all refined carbohydrates both sweets and starches.  Will work on the  plan and exercise.  Consider both aerobic and resistance training.  Will keep protein, water, and fiber intake high.  Increase Polyunsaturated and Monounsaturated fats to increase satiety and encourage weight loss.  Will resume weighing her protein on a regular basis. Will increase her water. She will start journaling on a regular basis.  We reviewed journaling and handouts were given.   Polyphagia Fayrene endorses excessive hunger.  Medication(s): Wegovy  2.4 mg  Effects of medication( appetite):  moderately controlled. Cravings are poorly controlled.   Plan: Medication(s): Wegovy  2.4 mg SQ weekly Will increase water, protein and fiber to help assuage hunger.  Will minimize foods that have a high glucose index/load to minimize reactive hypoglycemia.   Eating disorder/emotional eating Selma has had issues with stress eating, emotional eating, nighttime eating, and boredom eating. Currently this is moderately controlled. Overall mood is stable. Denies suicidal/homicidal ideation. Medication(s): none  Plan:  Specifically regarding patient's less desirable eating habits and patterns, we employed the  technique of small changes when she cannot fully commit to her prudent nutritional plan. Discussed distractions to curb eating behaviors. Discussed activities to do with one's hands in the evening  Be sure to get adequate rest as lack of rest can trigger appetite.  Have plan in place for stressful events.  Medication(s): Wellbutrin  150 mg daily in the am #30 with no refills.   Generalized Obesity: Current BMI BMI (Calculated): 32.85   Pharmacotherapy Plan Continue and refill  Wegovy  2.4 mg SQ weekly  Aurorah is currently in the action stage of change. As such, her goal is to continue with weight loss efforts.  She has agreed to the Category 2 plan.  Exercise goals: All adults should avoid inactivity. Some physical activity is better than none, and adults who participate in any amount of  physical activity gain some health benefits.  Behavioral modification strategies: increasing lean protein intake, no meal skipping, keep a strict food journal, weigh protein portions, measure portion sizes, and mindful eating.  Anjeanette has agreed to follow-up with our clinic in 4 weeks.    Objective:   VITALS: Per patient if applicable, see vitals. GENERAL: Alert and in no acute distress. CARDIOPULMONARY: No increased WOB. Speaking in clear sentences.  PSYCH: Pleasant and cooperative. Speech normal rate and rhythm. Affect is appropriate. Insight and judgement are appropriate. Attention is focused, linear, and appropriate.  NEURO: Oriented as arrived to appointment on time with no prompting.   Attestation Statements:   This was prepared with the assistance of Engineer, civil (consulting).  Occasional wrong-word or sound-a-like substitutions may have occurred due to the inherent limitations of voice recognition   Clayborne Daring, DO

## 2024-04-17 DIAGNOSIS — E042 Nontoxic multinodular goiter: Secondary | ICD-10-CM | POA: Diagnosis not present

## 2024-04-17 DIAGNOSIS — I10 Essential (primary) hypertension: Secondary | ICD-10-CM | POA: Diagnosis not present

## 2024-04-17 DIAGNOSIS — E7800 Pure hypercholesterolemia, unspecified: Secondary | ICD-10-CM | POA: Diagnosis not present

## 2024-04-17 DIAGNOSIS — E669 Obesity, unspecified: Secondary | ICD-10-CM | POA: Diagnosis not present

## 2024-04-18 ENCOUNTER — Other Ambulatory Visit: Payer: Self-pay | Admitting: Bariatrics

## 2024-04-18 ENCOUNTER — Telehealth: Payer: Self-pay | Admitting: Bariatrics

## 2024-04-18 ENCOUNTER — Other Ambulatory Visit (HOSPITAL_COMMUNITY): Payer: Self-pay

## 2024-04-18 MED ORDER — WEGOVY 2.4 MG/0.75ML ~~LOC~~ SOAJ: 2.4000 mg | SUBCUTANEOUS | 0 refills | Status: DC

## 2024-04-18 NOTE — Telephone Encounter (Signed)
 Patient stated when she went to pharmacy to pick up her Wegovy  and was told it is going to cost her 1,000 dollars unless she gets a 3 month supply through [facility]. Their Phone number is [phone]. Please sent 3 mo script to [facility].

## 2024-04-18 NOTE — Telephone Encounter (Signed)
 Can you please advise? Patient was last seen on 04/13/24 and has a follow-up on 05/10/24

## 2024-04-19 ENCOUNTER — Other Ambulatory Visit (HOSPITAL_COMMUNITY): Payer: Self-pay

## 2024-05-10 ENCOUNTER — Encounter: Payer: Self-pay | Admitting: Bariatrics

## 2024-05-10 ENCOUNTER — Ambulatory Visit: Admitting: Bariatrics

## 2024-05-10 VITALS — BP 126/81 | HR 60 | Temp 97.5°F | Ht 68.0 in | Wt 216.0 lb

## 2024-05-10 DIAGNOSIS — E669 Obesity, unspecified: Secondary | ICD-10-CM | POA: Diagnosis not present

## 2024-05-10 DIAGNOSIS — R632 Polyphagia: Secondary | ICD-10-CM

## 2024-05-10 DIAGNOSIS — Z6832 Body mass index (BMI) 32.0-32.9, adult: Secondary | ICD-10-CM

## 2024-05-10 DIAGNOSIS — E66811 Obesity, class 1: Secondary | ICD-10-CM | POA: Diagnosis not present

## 2024-05-10 DIAGNOSIS — F5089 Other specified eating disorder: Secondary | ICD-10-CM

## 2024-05-10 NOTE — Progress Notes (Signed)
 WEIGHT SUMMARY AND BIOMETRICS  Weight Lost Since Last Visit: 0  Weight Gained Since Last Visit: 0   Vitals Temp: (!) 97.5 F (36.4 C) BP: 126/81 Pulse Rate: 60 SpO2: 100 %   Anthropometric Measurements Height: 5' 8 (1.727 m) Weight: 216 lb (98 kg) BMI (Calculated): 32.85 Weight at Last Visit: 216lb Weight Lost Since Last Visit: 0 Weight Gained Since Last Visit: 0 Starting Weight: 244lb Total Weight Loss (lbs): 38 lb (17.2 kg)   Body Composition  Body Fat %: 39.3 % Fat Mass (lbs): 84.8 lbs Muscle Mass (lbs): 124.6 lbs Total Body Water (lbs): 91.4 lbs Visceral Fat Rating : 11   Other Clinical Data Fasting: no Labs: no Today's Visit #: 45 Starting Date: 04/22/20    OBESITY Jessica Drake is here to discuss her progress with her obesity treatment plan along with follow-up of her obesity related diagnoses.    Nutrition Plan: the Category 2 plan - 65% adherence.  Current exercise: cardiovascular workout on exercise equipment and weightlifting  Interim History:  Her weight remains the same.  Eating all of the food on the plan., Protein intake is as prescribed, Water intake is inadequate., and Reports excessive cravings.   Pharmacotherapy: Dayla is on Wegovy  2.4 mg SQ weekly Adverse side effects: None Hunger is moderately controlled.  Cravings are moderately controlled.  Assessment/Plan:   Makeyla Govan endorses excessive hunger.  Medication(s): Wegovy   Effects of medication:  moderately controlled. Cravings are moderately controlled.   Plan: Medication(s): Wegovy  2.4 mg SQ weekly Will increase water, protein and fiber to help assuage hunger.  Will minimize foods that have a high glucose index/load to minimize reactive hypoglycemia.   Eating disorder/emotional eating Deyna has had issues with stress eating, emotional eating, and boredom  eating. Currently this is moderately controlled. Overall mood is stable. Denies suicidal/homicidal ideation. Medication(s): Wellbutrin  150 mg daily in the am was prescribed at her last visit.  She did not start the medicine as she was worried about side effects and not being able to stop the medication if she had any issues..   Plan:  Specifically regarding patient's less desirable eating habits and patterns, we employed the technique of small changes when she cannot fully commit to her prudent nutritional plan. We discussed side effects, risks, benefits, indications of the medications, previous use, and the individual components of the medicine. I answered all of her questions to the best of my ability.  She will begin the Wellbutrin  at 150 mg daily.  She has medication at this time.  She will call me if she has any issues. Discussed distractions to curb eating behaviors. Discussed activities to do with one's hands in the evening  Be sure to get adequate rest as lack of rest can trigger appetite.  Have plan in place for stressful events.  Consider other rewards besides food.  Generalized Obesity: Current BMI BMI (Calculated): 32.85   Pharmacotherapy Plan Continue  Wegovy  2.4 mg SQ weekly  Macil is currently in the action stage of change. As such, her goal is to continue with weight loss efforts.  She has agreed to the Category 2 plan.  Exercise goals: All adults should avoid inactivity. Some physical activity is better than none, and adults who participate in any amount of physical activity gain some health benefits.  Behavioral modification strategies: increasing lean protein intake, decreasing simple carbohydrates , no meal skipping, decrease eating out, meal planning , better snacking choices, planning for success, ways to avoid night time snacking, keep healthy foods in the home, keep a strict food journal, weigh protein portions, measure portion sizes, and mindful  eating.  Soua has agreed to follow-up with our clinic in 4 weeks.    Objective:   VITALS: Per patient if applicable, see vitals. GENERAL: Alert and in no acute distress. CARDIOPULMONARY: No increased WOB. Speaking in clear sentences.  PSYCH: Pleasant and cooperative. Speech normal rate and rhythm. Affect is appropriate. Insight and judgement are appropriate. Attention is focused, linear, and appropriate.  NEURO: Oriented as arrived to appointment on time with no prompting.   Attestation Statements:   This was prepared with the assistance of Engineer, civil (consulting).  Occasional wrong-word or sound-a-like substitutions may have occurred due to the inherent limitations of voice recognition   Clayborne Daring, DO

## 2024-06-07 ENCOUNTER — Ambulatory Visit: Admitting: Bariatrics

## 2024-06-07 ENCOUNTER — Encounter: Payer: Self-pay | Admitting: Bariatrics

## 2024-06-07 VITALS — BP 110/69 | HR 67 | Temp 97.5°F | Ht 68.0 in | Wt 211.0 lb

## 2024-06-07 DIAGNOSIS — E669 Obesity, unspecified: Secondary | ICD-10-CM

## 2024-06-07 DIAGNOSIS — Z6832 Body mass index (BMI) 32.0-32.9, adult: Secondary | ICD-10-CM

## 2024-06-07 DIAGNOSIS — F5089 Other specified eating disorder: Secondary | ICD-10-CM | POA: Diagnosis not present

## 2024-06-07 DIAGNOSIS — R632 Polyphagia: Secondary | ICD-10-CM

## 2024-06-07 DIAGNOSIS — E66811 Obesity, class 1: Secondary | ICD-10-CM

## 2024-06-07 MED ORDER — WEGOVY 2.4 MG/0.75ML ~~LOC~~ SOAJ: 2.4000 mg | SUBCUTANEOUS | 0 refills | Status: DC

## 2024-06-07 MED ORDER — BUPROPION HCL ER (SR) 150 MG PO TB12: 150.0000 mg | ORAL_TABLET | Freq: Every day | ORAL | 0 refills | Status: DC

## 2024-06-07 NOTE — Progress Notes (Signed)
 WEIGHT SUMMARY AND BIOMETRICS  Weight Lost Since Last Visit: 5lb  Weight Gained Since Last Visit: 0   Vitals Temp: (!) 97.5 F (36.4 C) BP: 110/69 Pulse Rate: 67 SpO2: 100 %   Anthropometric Measurements Height: 5' 8 (1.727 m) Weight: 211 lb (95.7 kg) BMI (Calculated): 32.09 Weight at Last Visit: 216lb Weight Lost Since Last Visit: 5lb Weight Gained Since Last Visit: 0 Starting Weight: 244lb Total Weight Loss (lbs): 43 lb (19.5 kg)   Body Composition  Body Fat %: 37.5 % Fat Mass (lbs): 79.4 lbs Muscle Mass (lbs): 125.8 lbs Total Body Water (lbs): 89.4 lbs Visceral Fat Rating : 11   Other Clinical Data Fasting: no Labs: no Today's Visit #: 52 Starting Date: 04/22/20    OBESITY Jessica Drake is here to discuss her progress with her obesity treatment plan along with follow-up of her obesity related diagnoses.    Nutrition Plan: the Category 2 plan - 75% adherence.  Current exercise: cardiovascular workout on exercise equipment and weightlifting  Interim History:  She is down 5 lbs since her last visit.  Eating all of the food on the plan., Protein intake is as prescribed, Is not skipping meals, Journaling consistently., and Water intake is adequate.   Pharmacotherapy: Jessica Drake is on Wegovy  2.4 mg SQ weekly Adverse side effects: None Hunger is moderately controlled.  Cravings are moderately controlled.  Assessment/Plan:   Eating disorder/emotional eating Jessica Drake has had issues with stress eating, emotional eating, and boredom eating. She states that she has had less food cravings with the Wellbutrin .  Currently this is moderately controlled. Overall mood is stable. Denies suicidal/homicidal ideation. Medication(s): Wellbutrin  150 mg daily in the am  Plan:  Motivational interviewing as well as evidence-based interventions for health behavior change  were utilized today including the discussion of self monitoring techniques, problem-solving barriers and SMART goal setting techniques.  Discussed distractions to curb eating behaviors. Discussed activities to do with one's hands in the evening  Be sure to get adequate rest as lack of rest can trigger appetite.  Have plan in place for stressful events.  Consider other rewards besides food.    Polyphagia Jessica Drake endorses excessive hunger.  Medication(s): Wegovy  2.4 mg  Effects of medication:  moderately controlled. Cravings are moderately controlled.   Plan: Medication(s): Wegovy  2.4 mg SQ weekly Will increase water, protein and fiber to help assuage hunger.  Will minimize foods that have a high glucose index/load to minimize reactive hypoglycemia.  Will eat out less and cook more at home. Recipes given.     Generalized Obesity: Current BMI BMI (Calculated): 32.09   Pharmacotherapy Plan Continue and refill  Wegovy  2.4 mg SQ weekly  Jessica Drake is currently in the action stage of change. As such, her goal is to continue with weight loss efforts.  She has agreed to the Category 2 plan.  Exercise goals: All adults  should avoid inactivity. Some physical activity is better than none, and adults who participate in any amount of physical activity gain some health benefits.  Behavioral modification strategies: increase water intake, planning for success, increasing vegetables, increasing fiber rich foods, emotional eating strategies, keep healthy foods in the home, increase frequency of journaling, weigh protein portions, measure portion sizes, and mindful eating.  Jessica Drake has agreed to follow-up with our clinic in 4 weeks.       Objective:   VITALS: Per patient if applicable, see vitals. GENERAL: Alert and in no acute distress. CARDIOPULMONARY: No increased WOB. Speaking in clear sentences.  PSYCH: Pleasant and cooperative. Speech normal rate and rhythm. Affect is appropriate. Insight and  judgement are appropriate. Attention is focused, linear, and appropriate.  NEURO: Oriented as arrived to appointment on time with no prompting.   Attestation Statements:   This was prepared with the assistance of Engineer, civil (consulting).  Occasional wrong-word or sound-a-like substitutions may have occurred due to the inherent limitations of voice recognition   Clayborne Daring, DO

## 2024-06-13 ENCOUNTER — Telehealth: Payer: Self-pay | Admitting: Bariatrics

## 2024-06-13 MED ORDER — BUPROPION HCL ER (SR) 150 MG PO TB12: 150.0000 mg | ORAL_TABLET | Freq: Every day | ORAL | 0 refills | Status: DC

## 2024-06-13 NOTE — Telephone Encounter (Signed)
 Patient stated she needs a fill of Wellburtin. She stated Dr.Borwn sent it through [facility] delivery (3 month supply)but they are sending her letters saying the provider needs to call them. She was wondering if the medication can be sent to her pharmacy instead.

## 2024-07-11 ENCOUNTER — Ambulatory Visit: Admitting: Bariatrics

## 2024-07-11 ENCOUNTER — Encounter: Payer: Self-pay | Admitting: Bariatrics

## 2024-07-11 VITALS — BP 127/76 | HR 68 | Temp 97.7°F | Ht 68.0 in | Wt 218.0 lb

## 2024-07-11 DIAGNOSIS — F5089 Other specified eating disorder: Secondary | ICD-10-CM | POA: Diagnosis not present

## 2024-07-11 DIAGNOSIS — R632 Polyphagia: Secondary | ICD-10-CM

## 2024-07-11 DIAGNOSIS — Z6833 Body mass index (BMI) 33.0-33.9, adult: Secondary | ICD-10-CM

## 2024-07-11 DIAGNOSIS — E669 Obesity, unspecified: Secondary | ICD-10-CM | POA: Diagnosis not present

## 2024-07-11 DIAGNOSIS — E6609 Other obesity due to excess calories: Secondary | ICD-10-CM

## 2024-07-11 MED ORDER — BUPROPION HCL ER (SR) 150 MG PO TB12: 150.0000 mg | ORAL_TABLET | Freq: Every day | ORAL | 0 refills | Status: DC

## 2024-07-11 NOTE — Progress Notes (Signed)
 WEIGHT SUMMARY AND BIOMETRICS  Weight Lost Since Last Visit: 0  Weight Gained Since Last Visit: 7lb   Vitals Temp: 97.7 F (36.5 C) BP: 127/76 Pulse Rate: 68 SpO2: 99 %   Anthropometric Measurements Height: 5' 8 (1.727 m) Weight: 218 lb (98.9 kg) BMI (Calculated): 33.15 Weight at Last Visit: 211lb Weight Lost Since Last Visit: 0 Weight Gained Since Last Visit: 7lb Starting Weight: 244lb Total Weight Loss (lbs): 36 lb (16.3 kg)   Body Composition  Body Fat %: 37.7 % Fat Mass (lbs): 82.4 lbs Muscle Mass (lbs): 129.2 lbs Total Body Water (lbs): 93.4 lbs Visceral Fat Rating : 11   Other Clinical Data Fasting: no Labs: no Today's Visit #: 48 Starting Date: 04/22/20    OBESITY Jessica Drake is here to discuss her progress with her obesity treatment plan along with follow-up of her obesity related diagnoses.    Nutrition Plan: the Category 2 plan - 50% adherence.  Current exercise: none  Interim History:  She is up 7 lbs since her last visit.  Eating all of the food on the plan., Protein intake is as prescribed, Water intake is adequate., and Reports excessive cravings.   Pharmacotherapy: Jessica Drake is on Jessica Drake  2.4 mg SQ weekly. She states that she has been having more cravings and has had issues with getting her Jessica Drake . She states that she has had more stress at work.  Adverse side effects: None Hunger is moderately controlled.  Cravings are poorly controlled.  Assessment/Plan:   Jessica Drake endorses excessive hunger.  Medication(s): Jessica Drake  2.4 mg  Effects of medication (appetite) :  moderately controlled. Cravings are poorly controlled.   Plan: Medication(s): Continue Jessica Drake  2.4 mg SQ weekly Will increase water, protein and fiber to help assuage hunger.  Will minimize foods that have a high glucose index/load to minimize reactive  hypoglycemia.  Will weigh her protein daily and will eat her protein first.   Eating disorder/emotional eating Jessica Drake has had issues with stress eating, emotional eating, and boredom eating. Currently this is poorly controlled. Overall mood is stable. Denies suicidal/homicidal ideation. Medication(s): Jessica Drake  150 mg daily in the am  Plan:  Motivational interviewing as well as evidence-based interventions for health behavior change were utilized today including the discussion of self monitoring techniques, problem-solving barriers and SMART goal setting techniques.  Medication(s): Jessica Drake  150 mg daily in the am # 30 with no refills.  Discussed distractions to curb eating behaviors. Discussed activities to do with one's hands in the evening  Increase exercise both cardio and resistance. .      Generalized Obesity: Current BMI BMI (Calculated): 33.15   Pharmacotherapy Plan Continue  Jessica Drake  2.4 mg SQ weekly  Jessica Drake is currently in the action stage of change. As such, her goal is to continue with weight loss efforts.  She has agreed to the Category 2 plan.  Exercise goals: All adults  should avoid inactivity. Some physical activity is better than none, and adults who participate in any amount of physical activity gain some health benefits.  Behavioral modification strategies: increasing lean protein intake, no meal skipping, meal planning , increase water intake, better snacking choices, increasing lower sugar fruits, increasing fiber rich foods, decrease junk food, avoiding temptations, and keep healthy foods in the home.  Jessica Drake has agreed to follow-up with our clinic in 4 weeks.    Objective:   VITALS: Per patient if applicable, see vitals. GENERAL: Alert and in no acute distress. CARDIOPULMONARY: No increased WOB. Speaking in clear sentences.  PSYCH: Pleasant and cooperative. Speech normal rate and rhythm. Affect is appropriate. Insight and judgement are appropriate. Attention  is focused, linear, and appropriate.  NEURO: Oriented as arrived to appointment on time with no prompting.   Attestation Statements:   This was prepared with the assistance of Engineer, civil (consulting).  Occasional wrong-word or sound-a-like substitutions may have occurred due to the inherent limitations of voice recognition   Clayborne Daring, DO

## 2024-07-21 DIAGNOSIS — D1801 Hemangioma of skin and subcutaneous tissue: Secondary | ICD-10-CM | POA: Diagnosis not present

## 2024-07-21 DIAGNOSIS — L814 Other melanin hyperpigmentation: Secondary | ICD-10-CM | POA: Diagnosis not present

## 2024-07-21 DIAGNOSIS — L821 Other seborrheic keratosis: Secondary | ICD-10-CM | POA: Diagnosis not present

## 2024-07-21 DIAGNOSIS — D2372 Other benign neoplasm of skin of left lower limb, including hip: Secondary | ICD-10-CM | POA: Diagnosis not present

## 2024-08-02 DIAGNOSIS — Z1151 Encounter for screening for human papillomavirus (HPV): Secondary | ICD-10-CM | POA: Diagnosis not present

## 2024-08-02 DIAGNOSIS — Z6834 Body mass index (BMI) 34.0-34.9, adult: Secondary | ICD-10-CM | POA: Diagnosis not present

## 2024-08-02 DIAGNOSIS — Z124 Encounter for screening for malignant neoplasm of cervix: Secondary | ICD-10-CM | POA: Diagnosis not present

## 2024-08-02 DIAGNOSIS — Z01419 Encounter for gynecological examination (general) (routine) without abnormal findings: Secondary | ICD-10-CM | POA: Diagnosis not present

## 2024-08-02 DIAGNOSIS — Z1231 Encounter for screening mammogram for malignant neoplasm of breast: Secondary | ICD-10-CM | POA: Diagnosis not present

## 2024-08-08 ENCOUNTER — Other Ambulatory Visit (HOSPITAL_COMMUNITY): Payer: Self-pay | Admitting: Family Medicine

## 2024-08-08 ENCOUNTER — Encounter: Payer: Self-pay | Admitting: Bariatrics

## 2024-08-08 ENCOUNTER — Ambulatory Visit: Admitting: Bariatrics

## 2024-08-08 VITALS — BP 128/87 | HR 60 | Temp 97.7°F | Ht 68.0 in | Wt 214.0 lb

## 2024-08-08 DIAGNOSIS — R632 Polyphagia: Secondary | ICD-10-CM

## 2024-08-08 DIAGNOSIS — I10 Essential (primary) hypertension: Secondary | ICD-10-CM | POA: Diagnosis not present

## 2024-08-08 DIAGNOSIS — Z0000 Encounter for general adult medical examination without abnormal findings: Secondary | ICD-10-CM | POA: Diagnosis not present

## 2024-08-08 DIAGNOSIS — F5089 Other specified eating disorder: Secondary | ICD-10-CM | POA: Diagnosis not present

## 2024-08-08 DIAGNOSIS — E669 Obesity, unspecified: Secondary | ICD-10-CM

## 2024-08-08 DIAGNOSIS — M7071 Other bursitis of hip, right hip: Secondary | ICD-10-CM | POA: Diagnosis not present

## 2024-08-08 DIAGNOSIS — E7800 Pure hypercholesterolemia, unspecified: Secondary | ICD-10-CM | POA: Diagnosis not present

## 2024-08-08 DIAGNOSIS — Z23 Encounter for immunization: Secondary | ICD-10-CM | POA: Diagnosis not present

## 2024-08-08 DIAGNOSIS — E559 Vitamin D deficiency, unspecified: Secondary | ICD-10-CM | POA: Diagnosis not present

## 2024-08-08 DIAGNOSIS — E66811 Obesity, class 1: Secondary | ICD-10-CM

## 2024-08-08 DIAGNOSIS — E049 Nontoxic goiter, unspecified: Secondary | ICD-10-CM | POA: Diagnosis not present

## 2024-08-08 DIAGNOSIS — Z6832 Body mass index (BMI) 32.0-32.9, adult: Secondary | ICD-10-CM | POA: Diagnosis not present

## 2024-08-08 MED ORDER — BUPROPION HCL ER (SR) 150 MG PO TB12: 150.0000 mg | ORAL_TABLET | Freq: Every day | ORAL | 0 refills | Status: DC

## 2024-08-08 MED ORDER — WEGOVY 2.4 MG/0.75ML ~~LOC~~ SOAJ: 2.4000 mg | SUBCUTANEOUS | 0 refills | Status: DC

## 2024-08-08 NOTE — Progress Notes (Signed)
 WEIGHT SUMMARY AND BIOMETRICS  Weight Lost Since Last Visit: 4lb  Weight Gained Since Last Visit: 0   Vitals Temp: 97.7 F (36.5 C) BP: 128/87 Pulse Rate: 60 SpO2: 100 %   Anthropometric Measurements Height: 5' 8 (1.727 m) Weight: 214 lb (97.1 kg) BMI (Calculated): 32.55 Weight at Last Visit: 218lb Weight Lost Since Last Visit: 4lb Weight Gained Since Last Visit: 0 Starting Weight: 244lb Total Weight Loss (lbs): 40 lb (18.1 kg)   Body Composition  Body Fat %: 40.2 % Fat Mass (lbs): 86.2 lbs Muscle Mass (lbs): 122 lbs Total Body Water (lbs): 89.8 lbs Visceral Fat Rating : 11   Other Clinical Data Fasting: no Labs: no Today's Visit #: 40 Starting Date: 04/22/20    OBESITY Jessica Drake is here to discuss her progress with her obesity treatment plan along with follow-up of her obesity related diagnoses.    Nutrition Plan: the Category 2 plan - 40% adherence.  Current exercise: cardiovascular workout on exercise equipment  Interim History:  She is down another 4 lbs since her last visit.  Eating all of the food on the plan., Protein intake is as prescribed, Is not skipping meals, and Water intake is adequate.   Pharmacotherapy: Jessica Drake is on Wegovy  2.4 mg SQ weekly Adverse side effects: None Hunger is moderately controlled.  Cravings are moderately controlled.  Assessment/Plan:   Jessica Drake endorses excessive hunger.  Medication(s): Wegovy  Effects of medication (appetite):  moderately controlled. Cravings are moderately controlled.   Plan: Medication(s): Wegovy  2.4 mg SQ weekly Will increase water, protein and fiber to help assuage hunger.  Will minimize foods that have a high glucose index/load to minimize reactive hypoglycemia.   Eating disorder/emotional eating Jessica Drake has had issues with stress eating and emotional  eating. Currently this is moderately controlled. Overall mood is stable. Denies suicidal/homicidal ideation. Medication(s): Wellbutrin  150 mg daily in the am  Plan:  Motivational interviewing as well as evidence-based interventions for health behavior change were utilized today including the discussion of self monitoring techniques, problem-solving barriers and SMART goal setting techniques.   Discussed distractions to curb eating behaviors. Discussed activities to do with one's hands in the evening  Be sure to get adequate rest as lack of rest can trigger appetite.  Have plan in place for stressful events.  Consider other rewards besides food.   Will do more meal planning Will continue to exercise.    Generalized Obesity: Current BMI BMI (Calculated): 32.55   Pharmacotherapy Plan Continue and refill  Wegovy  2.4 mg SQ weekly  Jessica Drake is currently in the action stage of change. As such, her goal is to continue with weight loss efforts.  She has agreed to the Category 2 plan.  Exercise goals: For substantial health benefits, adults should do at least 150 minutes (2 hours and 30 minutes) a week of moderate-intensity, or 75 minutes (1 hour  and 15 minutes) a week of vigorous-intensity aerobic physical activity, or an equivalent combination of moderate- and vigorous-intensity aerobic activity. Aerobic activity should be performed in episodes of at least 10 minutes, and preferably, it should be spread throughout the week.  Behavioral modification strategies: increasing lean protein intake, decreasing simple carbohydrates , no meal skipping, meal planning , better snacking choices, planning for success, increasing vegetables, increasing fiber rich foods, keep healthy foods in the home, weigh protein portions, measure portion sizes, and mindful eating.  [name] has agreed to follow-up with our clinic in 4 weeks.    Objective:   VITALS: Per patient if applicable, see vitals. GENERAL: Alert and  in no acute distress. CARDIOPULMONARY: No increased WOB. Speaking in clear sentences.  PSYCH: Pleasant and cooperative. Speech normal rate and rhythm. Affect is appropriate. Insight and judgement are appropriate. Attention is focused, linear, and appropriate.  NEURO: Oriented as arrived to appointment on time with no prompting.   Attestation Statements:   This was prepared with the assistance of Engineer, Civil (consulting).  Occasional wrong-word or sound-a-like substitutions may have occurred due to the inherent limitations of voice recognition   Clayborne Daring, DO

## 2024-08-17 ENCOUNTER — Telehealth (INDEPENDENT_AMBULATORY_CARE_PROVIDER_SITE_OTHER): Payer: Self-pay

## 2024-08-17 NOTE — Telephone Encounter (Signed)
 PA questions for Wegovy  2.4 MG  have been answered and all documentation has been included. Waiting on a determination.

## 2024-08-17 NOTE — Telephone Encounter (Signed)
 PA for Wegovy  2.4 MG has been submitted, awaiting PA questions.

## 2024-08-23 NOTE — Telephone Encounter (Signed)
 PA for Wegovy  2.4 has been determined as not needed. PA is now complete.

## 2024-08-25 ENCOUNTER — Ambulatory Visit (HOSPITAL_COMMUNITY)
Admission: RE | Admit: 2024-08-25 | Discharge: 2024-08-25 | Disposition: A | Discharge status: Home / Self Care | Payer: Self-pay | Source: Ambulatory Visit | Attending: Family Medicine | Admitting: Family Medicine

## 2024-08-25 DIAGNOSIS — E7800 Pure hypercholesterolemia, unspecified: Secondary | ICD-10-CM | POA: Insufficient documentation

## 2024-09-05 ENCOUNTER — Encounter: Payer: Self-pay | Admitting: Bariatrics

## 2024-09-05 ENCOUNTER — Ambulatory Visit: Admitting: Bariatrics

## 2024-09-05 VITALS — BP 131/83 | HR 71 | Ht 68.0 in | Wt 215.0 lb

## 2024-09-05 DIAGNOSIS — Z6832 Body mass index (BMI) 32.0-32.9, adult: Secondary | ICD-10-CM | POA: Diagnosis not present

## 2024-09-05 DIAGNOSIS — F5089 Other specified eating disorder: Secondary | ICD-10-CM

## 2024-09-05 DIAGNOSIS — E669 Obesity, unspecified: Secondary | ICD-10-CM

## 2024-09-05 DIAGNOSIS — R632 Polyphagia: Secondary | ICD-10-CM

## 2024-09-05 MED ORDER — WEGOVY 2.4 MG/0.75ML ~~LOC~~ SOAJ: 2.4000 mg | SUBCUTANEOUS | 0 refills | Status: AC

## 2024-09-05 MED ORDER — BUPROPION HCL ER (SR) 150 MG PO TB12: 150.0000 mg | ORAL_TABLET | Freq: Every day | ORAL | 0 refills | Status: DC

## 2024-09-05 NOTE — Progress Notes (Signed)
 WEIGHT SUMMARY AND BIOMETRICS  Weight Lost Since Last Visit: 0  Weight Gained Since Last Visit: 1lb   Vitals BP: 131/83 Pulse Rate: 71 SpO2: 96 %   Anthropometric Measurements Height: 5' 8 (1.727 m) Weight: 215 lb (97.5 kg) BMI (Calculated): 32.7 Weight at Last Visit: 214lb Weight Lost Since Last Visit: 0 Weight Gained Since Last Visit: 1lb Starting Weight: 244lb Total Weight Loss (lbs): 39 lb (17.7 kg)   Body Composition  Body Fat %: 41.5 % Fat Mass (lbs): 89.2 lbs Muscle Mass (lbs): 119.4 lbs Total Body Water (lbs): 88.6 lbs Visceral Fat Rating : 12   Other Clinical Data Fasting: no Labs: no Today's Visit #: 74 Starting Date: 04/22/20    OBESITY Moriya is here to discuss her progress with her obesity treatment plan along with follow-up of her obesity related diagnoses.    Nutrition Plan: the Category 2 plan - 40% adherence.  Current exercise: none  Interim History:  She is up 1 lb since her last visit.  Eating all of the food on the plan., Protein intake is as prescribed, Is not skipping meals, and Water intake is adequate.   Pharmacotherapy: Carolann is on Wegovy  2.4 mg SQ weekly Adverse side effects: None Hunger is moderately controlled.  Cravings are moderately controlled.  Assessment/Plan:    Eating disorder/emotional eating Stefanie has had issues with stress eating, emotional eating, and boredom eating. Currently this is moderately controlled. Overall mood is stable. Denies suicidal/homicidal ideation. Medication(s): Wellbutrin  150 mg daily in the am  Plan:  Discussed cues and consequences, how thoughts affect eating, model of thoughts, feelings, and behaviors, and strategies for change by focusing on the cue. Discussed cognitive distortions, coping thoughts, and how to change your thoughts. Will continue to read labels.   Discussed distractions to curb eating behaviors. Discussed activities to do with one's hands in the evening  Be sure to get adequate rest as lack of rest can trigger appetite.  Have plan in place for stressful events.  Consider other rewards besides food.    Polyphagia Sharry endorses excessive hunger.  Medication(s): Wegovy  2.4 mg  Effects of medication (appetite) :  moderately controlled. Cravings are moderately controlled.   Plan: Medication(s): Wegovy  2.4 mg SQ weekly Will increase water, protein and fiber to help assuage hunger.  Will minimize foods that have a high glucose index/load to minimize reactive hypoglycemia.    Generalized Obesity: Current BMI BMI (Calculated): 32.7   Pharmacotherapy Plan Continue and refill  Wegovy  2.4 mg SQ weekly  Rheta is currently in the action stage of change. As such, her goal is to continue with weight loss efforts.  She has agreed to the Category 2 plan.  Exercise goals: All adults should avoid inactivity. Some physical activity is better than none, and adults who participate in any amount of physical activity gain some health benefits.  Behavioral  modification strategies: increasing lean protein intake, no meal skipping, meal planning , increase water intake, better snacking choices, and planning for success.  Lourdes has agreed to follow-up with our clinic in 4 weeks.    Objective:   VITALS: Per patient if applicable, see vitals. GENERAL: Alert and in no acute distress. CARDIOPULMONARY: No increased WOB. Speaking in clear sentences.  PSYCH: Pleasant and cooperative. Speech normal rate and rhythm. Affect is appropriate. Insight and judgement are appropriate. Attention is focused, linear, and appropriate.  NEURO: Oriented as arrived to appointment on time with no prompting.   Attestation Statements:   This was prepared with the assistance of Engineer, Civil (consulting).  Occasional wrong-word or sound-a-like substitutions may have  occurred due to the inherent limitations of voice recognition.   Clayborne Daring, DO

## 2024-09-20 ENCOUNTER — Encounter: Payer: Self-pay | Admitting: Orthopaedic Surgery

## 2024-09-20 ENCOUNTER — Other Ambulatory Visit: Payer: Self-pay

## 2024-09-20 ENCOUNTER — Ambulatory Visit: Admitting: Orthopaedic Surgery

## 2024-09-20 DIAGNOSIS — M25551 Pain in right hip: Secondary | ICD-10-CM

## 2024-09-20 DIAGNOSIS — M79672 Pain in left foot: Secondary | ICD-10-CM

## 2024-09-20 DIAGNOSIS — M67959 Unspecified disorder of synovium and tendon, unspecified thigh: Secondary | ICD-10-CM | POA: Diagnosis not present

## 2024-09-20 DIAGNOSIS — M19072 Primary osteoarthritis, left ankle and foot: Secondary | ICD-10-CM | POA: Diagnosis not present

## 2024-09-20 MED ORDER — BUPIVACAINE HCL 0.5 % IJ SOLN
3.0000 mL | INTRAMUSCULAR | Status: AC | PRN
  Administered 2024-09-20: 14:00:00 3 mL via INTRA_ARTICULAR

## 2024-09-20 MED ORDER — LIDOCAINE HCL 1 % IJ SOLN
3.0000 mL | INTRAMUSCULAR | Status: AC | PRN
  Administered 2024-09-20: 14:00:00 3 mL

## 2024-09-20 MED ORDER — METHYLPREDNISOLONE ACETATE 40 MG/ML IJ SUSP
40.0000 mg | INTRAMUSCULAR | Status: AC | PRN
  Administered 2024-09-20: 14:00:00 40 mg via INTRA_ARTICULAR

## 2024-09-20 NOTE — Progress Notes (Signed)
 Office Visit Note   Patient: Jessica Drake           Date of Birth: 07-Dec-1960           MRN: [id number] Visit Date: 09/20/2024              Requested by: Seabron Lenis, MD [phone] [place],  [place] [address] PCP: Seabron Lenis, MD   Assessment & Plan: Visit Diagnoses:  1. Pain in right hip   2. Pain in left foot   3. Greater trochanteric pain syndrome of right lower extremity   4. Tendinopathy of gluteus medius   5. Arthritis of left midfoot     Plan: In terms of her right lateral hip pain patient diagnosed with right glute medius tendinitis versus trochanteric bursitis.  Patient elected to proceed with ultrasound-guided corticosteroid injection at today's visit.  Will also refer patient to physical therapy for hip abduction strengthening followed by home exercise program when she feels like she has mastered exercises on her own.  In terms of her left dorsal foot pain patient evidence of moderate osteoarthritis of her midfoot as well as extensor tendinitis.  Recommend patient continue to wear custom orthotics is much as possible and try applying Voltaren gel 3 times daily over dorsal aspect of foot for additional relief.  Patient agrees with treatment plan.  Follow-up if pain worsens or persists.  Follow-Up Instructions: No follow-ups on file.   Orders:  Orders Placed This Encounter  Procedures   XR HIP UNILAT W OR W/O PELVIS 2-3 VIEWS RIGHT   XR Foot Complete Left   US  Guided Needle Placement - No Linked Charges   No orders of the defined types were placed in this encounter.     Procedures: US -Guided Greater Trochanteric Bursa Injection, Right After discussion on risks/benefits/indications and informed verbal consent was obtained, a timeout was performed. The patient was lying in lateral recumbent position on exam table. Using ultrasound guidance, the greater trochanter was identified. The area overlying the trochanteric bursa was then prepped with  chlorhexadine and alcohol swabs. Following sterile precautions, ultrasound was reapplied to visualize needle guidance with a 22-gauge 3.5 needle utilizing an in-plane approach to inject the bursa with 2:2:1 lidocaine :bupivicaine:betamethasone. Delivery of the injectate was visualized into the region of hypoechoic fluid of the greater trochanteric bursa. Patient tolerated procedure well without immediate complications.      Clinical Data: No additional findings.   Subjective: Chief Complaint  Patient presents with   Right Hip - Pain   Left Foot - Pain    HPI  Patient is a 63 year old female who presents today complaining of right lateral hip pain that has been ongoing for the past 3 months.  Denies any known injury.  Endorses pain primarily over lateral aspect of her hip.  Denies any groin pain or low back pain at this time.  Pain has been getting worse with time.  She has trouble sleeping on her right side.  She was prescribed Celebrex  by her PCP but this did not help.  Additional aggravating activities include prolonged walking or getting in and out of cars.  She does have pain to palpation over her right greater trochanter.  Patient also having some ongoing left foot pain for the past 5 months.  Endorses pain over the dorsal aspect of her foot.  She is wearing custom orthotics which she says sometimes improves some of her symptoms.  She occasionally takes Tylenol  for additional pain relief but  not daily.  States that her foot feels stiff.  Denies any known injuries or inciting factors.  Has been diagnosed with extensor tendinopathy in the past but this improved on its own.  Review of Systems  Constitutional: Negative.   HENT: Negative.    Eyes: Negative.   Respiratory: Negative.    Cardiovascular: Negative.   Endocrine: Negative.   Musculoskeletal: Negative.   Neurological: Negative.   Hematological: Negative.   Psychiatric/Behavioral: Negative.    All other systems reviewed and are  negative.   ROS negative except as noted in HPI above    Objective: Vital Signs: There were no vitals taken for this visit.  Physical Exam Vitals and nursing note reviewed.  Constitutional:      Appearance: She is well-developed.  HENT:     Head: Atraumatic.     Nose: Nose normal.  Eyes:     Extraocular Movements: Extraocular movements intact.  Cardiovascular:     Pulses: Normal pulses.  Pulmonary:     Effort: Pulmonary effort is normal.  Abdominal:     Palpations: Abdomen is soft.  Musculoskeletal:     Cervical back: Neck supple.  Skin:    General: Skin is warm.     Capillary Refill: Capillary refill takes less than 2 seconds.  Neurological:     Mental Status: She is alert. Mental status is at baseline.  Psychiatric:        Behavior: Behavior normal.        Thought Content: Thought content normal.        Judgment: Judgment normal.     Ortho Exam  Right lateral hip: On inspection patient has no evidence of erythema, ecchymosis, or generalized edema surrounding her right hip.  Patient has tenderness to palpation over her greater trochanter.  Patient has full range of motion of her right hip in all planes of motion.  Patient has 5/5 strength in all planes of motion with the exception of right hip abduction, 4/5.  Negative leg roll test.  Negative Stinchfield test.  Negative FABER/FADIR test.  Does have recreatable pain with resisted hip abduction.  Left foot: Inspection patient has mild swelling over the dorsal aspect of her midfoot.  No erythema or ecchymosis.  Patient has some mild tenderness to palpation over attachment of tibialis anterior and common extensor tendon.  Patient has full range of motion of left foot and ankle.  Patient does have reproducible pain with full plantarflexion on dorsal aspect of foot.  Patient has mild reproducible pain with resisted dorsiflexion.  Negative [place] rules of ankle.  Specialty Comments:  No specialty comments  available.  Imaging: XR HIP UNILAT W OR W/O PELVIS 2-3 VIEWS RIGHT Result Date: 09/20/2024 2 view of right hip x-rays obtained.  No evidence of acute osseous abnormalities.  Overall femoral acetabular joint is well-preserved with minimal signs of osteoarthritis.  XR Foot Complete Left Result Date: 09/20/2024 No acute osseous abnormalities noted.  Evidence of moderate midfoot arthritis with subchondral cyst formation and arthritic spurring.  US  Guided Needle Placement - No Linked Charges Result Date: 09/20/2024 US -Guided Greater Trochanteric Bursa Injection, Right After discussion on risks/benefits/indications and informed verbal consent was obtained, a timeout was performed. The patient was lying in lateral recumbent position on exam table. Using ultrasound guidance, the greater trochanter was identified. The area overlying the trochanteric bursa was then prepped with chlorhexadine and alcohol swabs. Following sterile precautions, ultrasound was reapplied to visualize needle guidance with a 22-gauge 3.5 needle utilizing an in-plane approach  to inject the bursa with 2:2:1 lidocaine :bupivicaine:betamethasone. Delivery of the injectate was visualized into the region of hypoechoic fluid of the greater trochanteric bursa. Patient tolerated procedure well without immediate complications.       PMFS History: Patient Active Problem List   Diagnosis Date Noted   Thyroid  nodule 06/21/2023   Enlarged thyroid  gland 06/16/2023   Generalized obesity 11/24/2022   BMI 33.0-33.9,adult 11/24/2022   Polyphagia 09/15/2022   Class 2 severe obesity with serious comorbidity and body mass index (BMI) of 37.0 to 37.9 in adult 08/12/2022   Essential hypertension 06/24/2022   Pre-diabetes 06/24/2022   Elevated cholesterol 03/11/2022   Vitamin D  deficiency 03/10/2022   Insulin  resistance 03/10/2022   Multiple thyroid  nodules 08/12/2021   At risk for diabetes mellitus 01/16/2021   Class 2 obesity due to excess  calories with body mass index (BMI) of 38.0 to 38.9 in adult 05/07/2020   Prediabetes 04/23/2020   Hypertensive disorder 07/10/2019   Past Medical History:  Diagnosis Date   Arthritis    Back pain    High blood pressure    Pre-diabetes     Family History  Problem Relation Age of Onset   High blood pressure Mother    High Cholesterol Mother    Cancer Mother    Obesity Mother    Diabetes Father    High blood pressure Father     Past Surgical History:  Procedure Laterality Date   COLONOSCOPY     DILATION AND CURETTAGE OF UTERUS     THYROID  LOBECTOMY Right 06/21/2023   Procedure: RIGHT THYROID  LOBECTOMY;  Surgeon: Eletha Boas, MD;  Location: [facility];  Service: General;  Laterality: Right;   Social History   Occupational History   Occupation: Retail Buyer  Tobacco Use   Smoking status: Never    Passive exposure: Never   Smokeless tobacco: Never  Vaping Use   Vaping status: Never Used  Substance and Sexual Activity   Alcohol use: Yes    Comment: occas.   Drug use: Never   Sexual activity: Not on file      Procedure Note  Patient: Jessica Drake             Date of Birth: 07-22-1961           MRN: [id number]             Visit Date: 09/20/2024  Procedures: Visit Diagnoses:  1. Pain in right hip   2. Pain in left foot   3. Greater trochanteric pain syndrome of right lower extremity   4. Tendinopathy of gluteus medius   5. Arthritis of left midfoot     Large Joint Inj: R greater trochanter on 09/20/2024 1:30 PM Indications: pain Details: 22 G needle, ultrasound-guided  Arthrogram: No  Medications: 3 mL lidocaine  1 %; 3 mL bupivacaine  0.5 %; 40 mg methylPREDNISolone  acetate 40 MG/ML Patient was prepped and draped in the usual sterile fashion.

## 2024-10-12 ENCOUNTER — Encounter: Payer: Self-pay | Admitting: Bariatrics

## 2024-10-12 ENCOUNTER — Ambulatory Visit: Admitting: Bariatrics

## 2024-10-12 VITALS — BP 125/80 | HR 71 | Temp 97.3°F | Ht 68.0 in | Wt 212.0 lb

## 2024-10-12 DIAGNOSIS — E669 Obesity, unspecified: Secondary | ICD-10-CM

## 2024-10-12 DIAGNOSIS — Z6832 Body mass index (BMI) 32.0-32.9, adult: Secondary | ICD-10-CM | POA: Diagnosis not present

## 2024-10-12 DIAGNOSIS — F5089 Other specified eating disorder: Secondary | ICD-10-CM | POA: Diagnosis not present

## 2024-10-12 DIAGNOSIS — E66811 Obesity, class 1: Secondary | ICD-10-CM

## 2024-10-12 DIAGNOSIS — R7303 Prediabetes: Secondary | ICD-10-CM

## 2024-10-12 MED ORDER — BUPROPION HCL ER (SR) 150 MG PO TB12: 150.0000 mg | ORAL_TABLET | Freq: Every day | ORAL | 0 refills | Status: AC

## 2024-10-12 NOTE — Progress Notes (Signed)
 "                                                                                                             WEIGHT SUMMARY AND BIOMETRICS  Weight Lost Since Last Visit: 3 lb  Weight Gained Since Last Visit: 0   Vitals Temp: (!) 97.3 F (36.3 C) BP: 125/80 Pulse Rate: 71 SpO2: 99 %   Anthropometric Measurements Height: 5' 8 (1.727 m) Weight: 212 lb (96.2 kg) BMI (Calculated): 32.24 Weight at Last Visit: 215 lb Weight Lost Since Last Visit: 3 lb Weight Gained Since Last Visit: 0 Starting Weight: 244 lb Total Weight Loss (lbs): 32 lb (14.5 kg) Peak Weight: 260 lb   Body Composition  Body Fat %: 40.3 % Fat Mass (lbs): 85.6 lbs Muscle Mass (lbs): 120.6 lbs Total Body Water (lbs): 84.6 lbs Visceral Fat Rating : 11   Other Clinical Data Fasting: no Labs: no Today's Visit #: 33 Starting Date: 04/22/20    OBESITY Roselynne is here to discuss her progress with her obesity treatment plan along with follow-up of her obesity related diagnoses.    Nutrition Plan: the Category 2 plan - 20% adherence.  Current exercise: none  Interim History:  She is down another 3 lbs since her last visit.  She states that she ate more during the holiday break and now is ready to get back on track. Eating all of the food on the plan., Protein intake is as prescribed, Is not skipping meals, and Water intake is adequate.   Pharmacotherapy: Clema is on Wegovy  2.4 mg SQ weekly (insurance will no longer pay for the prescription).  Adverse side effects: None Hunger is moderately controlled.  Cravings are moderately controlled.  Assessment/Plan:   Eating disorder/emotional eating Arnice has had issues with stress eating, emotional eating, and nighttime eating. Currently this is moderately controlled. Overall mood is stable. Denies suicidal/homicidal ideation. Medication(s): Wellbutrin  150 mg daily in the am  Plan:  Motivational interviewing as well as evidence-based interventions  for health behavior change were utilized today including the discussion of self monitoring techniques, problem-solving barriers and SMART goal setting techniques.  Discussed distractions to curb eating behaviors. Discussed activities to do with one's hands in the evening  Be sure to get adequate rest as lack of rest can trigger appetite.  Will resume exercise doing both cardio and resistance.  Will keep healthy snacks at her home. .    Prediabetes Last A1c was 5.4  Medication(s): Wegovy  2.4 mg SQ weekly Lab Results  Component Value Date   HGBA1C 5.4 02/24/2023   HGBA1C 5.3 03/10/2022   HGBA1C 5.4 02/04/2021   HGBA1C 5.4 10/10/2020   HGBA1C 5.7 (H) 04/22/2020   Lab Results  Component Value Date   INSULIN  4.3 02/24/2023   INSULIN  5.7 03/10/2022   INSULIN  6.8 02/04/2021   INSULIN  6.0 10/10/2020   INSULIN  10.7 04/22/2020    Plan: Will minimize all refined carbohydrates both sweets and starches.  Will work on the plan and exercise.  Will adhere closely to the  program at least 85 to 95%. Will keep protein, water, and fiber intake high.  Increase Polyunsaturated and Monounsaturated fats to increase satiety and encourage weight loss.  Aim for 7 to 9 hours of sleep nightly.  Continue Wegovy  2.4 mg SQ weekly (has 2 boxes at home and will try to stretch out her supply over time). She will also begin taking her Wegovy  2.5 mg subcu weekly every 10 days instead of every week.  She will resume her exercise doing both cardio and resistance    Generalized Obesity: Current BMI BMI (Calculated): 32.24   Pharmacotherapy Plan Continue  Wegovy  2.4 mg SQ weekly  Graceanna is currently in the action stage of change. As such, her goal is to continue with weight loss efforts.  She has agreed to the Category 2 plan.  Exercise goals: For substantial health benefits, adults should do at least 150 minutes (2 hours and 30 minutes) a week of moderate-intensity, or 75 minutes (1 hour and 15 minutes) a  week of vigorous-intensity aerobic physical activity, or an equivalent combination of moderate- and vigorous-intensity aerobic activity. Aerobic activity should be performed in episodes of at least 10 minutes, and preferably, it should be spread throughout the week. She will resume some cardio and resistance.   Behavioral modification strategies: increasing lean protein intake, no meal skipping, meal planning , increase water intake, better snacking choices, planning for success, increasing vegetables, avoiding temptations, keep healthy foods in the home, increase frequency of journaling, weigh protein portions, measure portion sizes, and mindful eating.  Janna has agreed to follow-up with our clinic in 4 weeks.    Objective:   VITALS: Per patient if applicable, see vitals. GENERAL: Alert and in no acute distress. CARDIOPULMONARY: No increased WOB. Speaking in clear sentences.  PSYCH: Pleasant and cooperative. Speech normal rate and rhythm. Affect is appropriate. Insight and judgement are appropriate. Attention is focused, linear, and appropriate.  NEURO: Oriented as arrived to appointment on time with no prompting.   Attestation Statements:   This was prepared with the assistance of Engineer, Civil (consulting).  Occasional wrong-word or sound-a-like substitutions may have occurred due to the inherent limitations of voice recognition.   Clayborne Daring, DO    "

## 2024-11-09 ENCOUNTER — Ambulatory Visit: Admitting: Bariatrics

## 2024-12-06 ENCOUNTER — Ambulatory Visit: Admitting: Bariatrics
